# Patient Record
Sex: Female | Born: 1990 | Race: Black or African American | Hispanic: No | Marital: Single | Smoking: Never smoker
Health system: Southern US, Community
[De-identification: ages and names within clinical notes are randomized; demographics above are authoritative.]

## PROBLEM LIST (undated history)

## (undated) DIAGNOSIS — F250 Schizoaffective disorder, bipolar type: Secondary | ICD-10-CM

## (undated) DIAGNOSIS — R569 Unspecified convulsions: Secondary | ICD-10-CM

## (undated) DIAGNOSIS — F259 Schizoaffective disorder, unspecified: Secondary | ICD-10-CM

## (undated) DIAGNOSIS — F79 Unspecified intellectual disabilities: Secondary | ICD-10-CM

---
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
## 2011-01-15 DIAGNOSIS — F79 Unspecified intellectual disabilities: Secondary | ICD-10-CM

## 2012-08-20 DIAGNOSIS — F29 Unspecified psychosis not due to a substance or known physiological condition: Secondary | ICD-10-CM | POA: Insufficient documentation

## 2012-08-20 DIAGNOSIS — G40909 Epilepsy, unspecified, not intractable, without status epilepticus: Secondary | ICD-10-CM | POA: Insufficient documentation

## 2012-11-13 ENCOUNTER — Inpatient Hospital Stay: Payer: Self-pay | Admitting: Internal Medicine

## 2012-11-13 LAB — URINALYSIS, COMPLETE
Bilirubin,UR: NEGATIVE
Glucose,UR: NEGATIVE mg/dL (ref 0–75)
Nitrite: NEGATIVE
Nitrite: NEGATIVE
Ph: 5 (ref 4.5–8.0)
Ph: 6 (ref 4.5–8.0)
Protein: NEGATIVE
RBC,UR: 160 /HPF (ref 0–5)
RBC,UR: 39 /HPF (ref 0–5)
Specific Gravity: 1.013 (ref 1.003–1.030)
Specific Gravity: 1.006 (ref 1.003–1.030)
WBC UR: 5 /HPF (ref 0–5)

## 2012-11-13 LAB — COMPREHENSIVE METABOLIC PANEL
Albumin: 3.6 g/dL (ref 3.4–5.0)
Anion Gap: 6 — ABNORMAL LOW (ref 7–16)
Bilirubin,Total: 0.4 mg/dL (ref 0.2–1.0)
Calcium, Total: 9.3 mg/dL (ref 8.5–10.1)
Chloride: 102 mmol/L (ref 98–107)
EGFR (African American): >60
EGFR (Non-African Amer.): >60
Glucose: 124 mg/dL — ABNORMAL HIGH (ref 65–99)
SGOT(AST): 17 U/L (ref 15–37)
SGPT (ALT): 21 U/L (ref 12–78)
Sodium: 135 mmol/L — ABNORMAL LOW (ref 136–145)

## 2012-11-13 LAB — CBC
HCT: 32.6 % — ABNORMAL LOW (ref 35.0–47.0)
HGB: 11.8 g/dL — ABNORMAL LOW (ref 12.0–16.0)
MCH: 31.7 pg (ref 26.0–34.0)
MCV: 88 fL (ref 80–100)
Platelet: 195 10*3/uL (ref 150–440)
WBC: 7.2 10*3/uL (ref 3.6–11.0)

## 2012-11-15 LAB — URINE CULTURE

## 2012-11-18 LAB — CULTURE, BLOOD (SINGLE)

## 2012-12-27 DIAGNOSIS — N911 Secondary amenorrhea: Secondary | ICD-10-CM | POA: Insufficient documentation

## 2014-07-28 NOTE — H&P (Signed)
PATIENT NAME:  Monica [name], Monica [name] MR#:  [id number] DATE OF BIRTH:  10/31/1990  DATE OF ADMISSION:  11/13/2012  PRIMARY CARE PHYSICIAN:  [facility] [place]   CODE STATUS: FULL CODE.   CHIEF COMPLAINT: Fevers.   HISTORY OF PRESENT ILLNESS: This is a 24 year old female with history of mental retardation, who since yesterday has been having fevers persistent as high as 101. She has been having chills. Her appetite has been off. She has been somewhat confused. For this reason they brought her to the ER today. In the ER the patient was found to have temperatures up to 103. There is no report of any nausea and vomiting. The patient denies having any burning on urination but she does have some mental retardation. She denies any pain. There no reports of any lightheadedness or dizziness and no history of any recurrent UTIs. The hospitalist has been consulted to admit for urosepsis  REVIEW OF SYSTEMS: Difficult to obtain given patient's mental retardation.   PAST MEDICAL HISTORY: Includes MR and a history of seizure disorders.   PAST SURGICAL HISTORY: None.  MEDICATIONS: Seroquel 300 mg 2 tablets daily and 100 mg p.r.n., Senna 2 tablets p.r.n. constipation. Norco 5 p.r.n. pain, Neurontin 300 mg 2 capsules ? ,  ketoconazole cream for her skin, Nizoral topical shampoo, hydrocortisone topical cream p.r.n. Fanapt 6 mg tablets twice daily, Divalproex 500 mg 2 times daily, senna smooth scalp topical oil p.r.n. skin, Cleocin and amoxicillin p.r.n.   ALLERGIES: No known drug allergies.   SOCIAL HISTORY: Negative for tobacco, alcohol, illicit drugs. The patient resides at ? services.  She does not use a cane or walker. She is on home oxygen.   FAMILY HISTORY: Unknown and unable to obtain currently due to the patient's mentation.   PHYSICAL EXAMINATION:  VITAL SIGNS:  Blood pressure 76/50, pulse 153, respirations 20, temperature 103.2.  GENERAL: Awake, alert. The patient is asking to be fed.  EYES: Pink  conjunctivae. PERRLA.   ENT:  Moist oral mucosa. Trachea midline.  NECK: Supple.  LUNGS: Clear. No wheeze. No use of accessory muscles.  CARDIOVASCULAR: Regular rate and rhythm without murmurs, regurgitation or gallops. No JVD.  ABDOMEN: Soft, positive bowel sounds. Nontender, nondistended. No organomegaly.  NEUROLOGIC: Cranial nerves II through XII grossly intact. Sensation intact.  MUSCULOSKELETAL: Strength 5 out of 5 in all extremities. No clubbing, cyanosis tenderness. No CVA tenderness.  SKIN: The patient has diffuse rash appears to be fungal in the region.    LABORATORY DATA:  Fingerstick blood sugar 141. UA: The patient has white blood cells ? bacteria 1+, nitrates are negative, leukocyte esterase 3+ white blood cell clumps are present, (unclear if this is a cath specimen.) White blood count 7.2, hemoglobin 11.8, platelets 195, sodium 135, potassium 3.7, chloride 107, CO2 27, BUN 11, creatinine 1.16, glucose 124. LFTs are normal. Valproic acid level is 98.   ASSESSMENT AND PLAN: 1.  Urosepsis with hypotension. The patiuent looks better than her labs. I am going to repeat a BP and urinalysis and be sure that it is a cath specimen as the patient's urinalysis is somewhat odd given the large amount of leukocyte esterase and the few number of bacteria. Blood cultures will be collected. The patient will be started on empiric antibiotics. We will give the patient a bolus of 1 liter normal saline and have vital repeated. We will also add a chest x-ray to look for other etiology for the patient's fever. Tylenol with offered p.r.n. 2.  Mental  retardation, seizure disorder. Continue patient's home medications    ____________________________ Gery Prayebby Nakai Pollio, MD dc:dp D: 11/13/2012 16:59:25 ET T: 11/13/2012 17:25:26 ET JOB#: [id number]  cc: Gery Prayebby Juan Kissoon, MD, <Dictator> Gery PrayEBBY Skylen Spiering MD ELECTRONICALLY SIGNED 11/14/2012 17:53

## 2014-07-28 NOTE — Discharge Summary (Signed)
PATIENT NAME:  Monica [name], Monica [name] MR#:  [id number] DATE OF BIRTH:  Oct 07, 1990  DATE OF ADMISSION:  11/13/2012 DATE OF DISCHARGE:  11/14/2012  ADMITTING DIAGNOSIS: Urosepsis.   DISCHARGE DIAGNOSES:  1.  Systemic inflammatory response reaction.  2.  Questionable urinary tract infection.  3.  Questionable pneumonia.  4.  Hypotension that resolved on IV fluids.  5.  History of mental retardation as well as seizure disorder.   DISCHARGE CONDITION: Stable.   DISCHARGE MEDICATIONS: The patient is to continue ketoconazole topical cream once a day, divalproex sodium 500 mg p.o. twice daily, Fanapt 6 mg twice daily, Derma-Smoothe topical oil once a week, Nizoral topical 2% topical shampoo once a week, amoxicillin 500 mg by mouth 4 capsules once as needed before dental procedures, Seroquel 300 mg 2 tablets once daily at bedtime, Neurontin 300 mg  2 capsules at bedtime, hydrocortisone topical cream 1-2 times a week as needed, Seroquel 100 mg p.o. once daily as needed, Senexon 8.6 mg 3 tablets once at bedtime as needed, Norco 375/5 mg 1 tablet every six hours as needed, Levaquin 750 mg p.o. once daily for five days.   HOME OXYGEN: None.   DIET: Regular with regular consistency.    ACTIVITY: As tolerated.    FOLLOW-UP:  [facility] [place] primary care physician in 1 to 2 days after discharge.   CONSULTANTS: Care management.   RADIOLOGIC STUDIES: Chest x-ray, portable single view, November 13, 2012 revealed limited study due to hyperinflation, atelectasis or pneumonia in left lower lobe was suspected. PA and lateral chest x-rays were recommended for evaluation.   HOSPITAL COURSE:  The patient is a 24 year old African American female with a history of mental retardation who presents to the hospital with high fevers as well as chills. Please refer to Dr. Clovis Fredrickson admission note on November 13, 2012. The patient was somewhat confused and she was noted to have temperatures as high as 103. Otherwise, she had no  significant abnormalities. The physical exam was done unremarkable. However, the patient's blood pressure was low at 76/50 in the Emergency Room and she was tachycardic with pulse of 153. The patient's lab data done in the Emergency Room showed a normal BMP except for sodium 135, glucose 124, otherwise BMP was unremarkable. The patient's liver enzymes were unremarkable; however, the patient's total protein level was slightly elevated at 8.6. The patient's valproic acid level was 98, which was therapeutic. White blood cell count was normal at 10.2, hemoglobin was 11.8, and platelet count was 195. The patient's urinalysis revealed cloudy urine, negative for glucose, bilirubin or ketones. Specific gravity was 1.013, pH was 5, 3+ blood, negative for protein, nitrites, 3+ leukocytes esterase were noted, 160 red blood cells, 178 white blood cells were noted as well as 1+ bacteria and 1 epithelial cell and white blood cell clumps. The patient was admitted to the hospital for further evaluation. She was started on antibiotic therapy with Rocephin; with this the patient's fevers subsided. On 11/14/2012, the patient was afebrile feeling well. She remained, however, tachycardic, but refused any IV fluid administration. We felt that behavioral problems precluded her with  treatment here. However, since her blood pressure was within normal limits, we felt that the patient can be treated as outpatient with oral antibiotics. Because her chest x-ray was somewhat abnormal, concerned about possible pneumonia, we made a decision to initiate her on Levaquin orally. The patient will use Levaquin for five days and follow up with her primary care physician for further recommendations. On  the day of discharge the patient's temperature was 99.4, pulse ranging from 105 to 130, respiration rate was 17-20, blood pressure ranging from 124-117 systolic and oxygen saturation were 98% to 99% on room air at rest. Of note, the patient's blood  cultures x 2 taken on 11/13/2012, showed no growth in 8-12 hours. Urine culture revealed a 60 (Dictation Anomaly)thousand cfu  gram-negative rod; ID to follow. Repeated urinalysis done on the same day, 11/13/2012, was also found to be abnormal; however, there were more red cells than white blood cells, but white cell clumps were also noted concerning for urinary tract infection.   TIME SPENT: 40 minutes   ____________________________ Katharina Caperima Diesel Lina, MD rv:cc D: 11/14/2012 14:48:46 ET T: 11/14/2012 22:14:05 ET JOB#: [id number]  cc: Katharina Caperima Demont Linford, MD, <Dictator> [facility] [place] Edita Weyenberg MD ELECTRONICALLY SIGNED 11/24/2012 12:08

## 2014-12-16 ENCOUNTER — Encounter: Payer: Self-pay | Admitting: Emergency Medicine

## 2014-12-16 ENCOUNTER — Emergency Department
Admission: EM | Admit: 2014-12-16 | Discharge: 2014-12-16 | Disposition: A | Discharge status: Home / Self Care | Payer: Medicaid Other | Attending: Emergency Medicine | Admitting: Emergency Medicine

## 2014-12-16 DIAGNOSIS — Y9389 Activity, other specified: Secondary | ICD-10-CM | POA: Diagnosis not present

## 2014-12-16 DIAGNOSIS — Y9289 Other specified places as the place of occurrence of the external cause: Secondary | ICD-10-CM | POA: Diagnosis not present

## 2014-12-16 DIAGNOSIS — S0990XA Unspecified injury of head, initial encounter: Secondary | ICD-10-CM | POA: Diagnosis present

## 2014-12-16 DIAGNOSIS — W2209XA Striking against other stationary object, initial encounter: Secondary | ICD-10-CM | POA: Insufficient documentation

## 2014-12-16 DIAGNOSIS — Y998 Other external cause status: Secondary | ICD-10-CM | POA: Diagnosis not present

## 2014-12-16 DIAGNOSIS — S0093XA Contusion of unspecified part of head, initial encounter: Secondary | ICD-10-CM | POA: Insufficient documentation

## 2014-12-16 DIAGNOSIS — F259 Schizoaffective disorder, unspecified: Secondary | ICD-10-CM | POA: Insufficient documentation

## 2014-12-16 HISTORY — DX: Unspecified convulsions: R56.9

## 2014-12-16 HISTORY — DX: Schizoaffective disorder, bipolar type: F25.0

## 2014-12-16 HISTORY — DX: Unspecified intellectual disabilities: F79

## 2014-12-16 HISTORY — DX: Schizoaffective disorder, unspecified: F25.9

## 2014-12-16 NOTE — ED Provider Notes (Signed)
[facility] Emergency Department Provider Note  ____________________________________________  Time seen: Approximately 6:56 PM  I have reviewed the triage vital signs and the nursing notes.   HISTORY  Chief Complaint Head Injury    HPI Monica [name] is a 24 y.o. female born in by care giver. Patient states patient is taking a shower bent over and hit her head on the corner of the wall. There was no loss of consciousness caregiver's nose no abrasions or lacerations. Patient brought back here for medical clearance for returning back home. Patient is mentally handicapped.   Past Medical History  Diagnosis Date  . Mentally disabled   . Seizures   . Schizo affective schizophrenia     There are no active problems to display for this patient.   History reviewed. No pertinent past surgical history.  No current outpatient prescriptions on file.  Allergies Review of patient's allergies indicates no known allergies.  No family history on file.  Social History Social History  Substance Use Topics  . Smoking status: Never Smoker   . Smokeless tobacco: None  . Alcohol Use: No    Review of Systems Constitutional: No fever/chills Eyes: No visual changes. ENT: No sore throat. Cardiovascular: Denies chest pain. Respiratory: Denies shortness of breath. Gastrointestinal: No abdominal pain.  No nausea, no vomiting.  No diarrhea.  No constipation. Genitourinary: Negative for dysuria. Musculoskeletal: Negative for back pain. Skin: Negative for rash. Neurological: Negative for headaches, focal weakness or numbness. Psychiatric:Schizo affective disorder 10-point ROS otherwise negative.  ____________________________________________   PHYSICAL EXAM:  VITAL SIGNS: ED Triage Vitals  Enc Vitals Group     BP 12/16/14 1815 124/84 mmHg     Pulse Rate 12/16/14 1815 109     Resp 12/16/14 1815 18     Temp 12/16/14 1815 98.4 F (36.9 C)     Temp Source  12/16/14 1815 Oral     SpO2 12/16/14 1815 98 %     Weight 12/16/14 1841 153 lb (69.4 kg)     Height --      Head Cir --      Peak Flow --      Pain Score --      Pain Loc --      Pain Edu? --      Excl. in GC? --     Constitutional: Alert and oriented. Well appearing and in no acute distress. Eyes: Conjunctivae are normal. PERRL. EOMI. Head: Atraumatic. No abrasions or ecchymosis or laceration. Nose: No congestion/rhinnorhea. Mouth/Throat: Mucous membranes are moist.  Oropharynx non-erythematous. Neck: No stridor.   Hematological/Lymphatic/Immunilogical: No cervical lymphadenopathy. Cardiovascular: Normal rate, regular rhythm. Grossly normal heart sounds.  Good peripheral circulation. Respiratory: Normal respiratory effort.  No retractions. Lungs CTAB. Gastrointestinal: Soft and nontender. No distention. No abdominal bruits. No CVA tenderness. Musculoskeletal: No lower extremity tenderness nor edema.  No joint effusions. Neurologic:  Normal speech and language. No gross focal neurologic deficits are appreciated. No gait instability. Skin:  Skin is warm, dry and intact. No rash noted. Psychiatric: Mood and affect are normal. Speech and behavior are normal.  ____________________________________________   LABS (all labs ordered are listed, but only abnormal results are displayed)  Labs Reviewed - No data to display ____________________________________________  EKG   ____________________________________________  RADIOLOGY   ____________________________________________   PROCEDURES  Procedure(s) performed: None  Critical Care performed: No  ____________________________________________   INITIAL IMPRESSION / ASSESSMENT AND PLAN / ED COURSE  Pertinent labs & imaging results that were  available during my care of the patient were reviewed by me and considered in my medical decision making (see chart for details).  Facial contusion. Advised on supportive care at this  time. Patient follow up PCP as needed. ____________________________________________   FINAL CLINICAL IMPRESSION(S) / ED DIAGNOSES  Final diagnoses:  Head contusion, initial encounter      Joni Reining, PA-C 12/16/14 1905  Myrna Blazer, MD 12/17/14 [phone]

## 2014-12-16 NOTE — ED Notes (Addendum)
Pt brought in by caregiver. Caregiver reports that the pt was in the shower and hit her while bending down to wash her legs. Denies LOC or abrasions. Pt denies pain. Pt is mentally handicapped.

## 2015-01-17 ENCOUNTER — Emergency Department
Admission: EM | Admit: 2015-01-17 | Discharge: 2015-01-17 | Disposition: A | Discharge status: Home / Self Care | Payer: Medicaid Other | Attending: Emergency Medicine | Admitting: Emergency Medicine

## 2015-01-17 ENCOUNTER — Emergency Department: Payer: Medicaid Other

## 2015-01-17 DIAGNOSIS — S0083XA Contusion of other part of head, initial encounter: Secondary | ICD-10-CM | POA: Insufficient documentation

## 2015-01-17 DIAGNOSIS — Y998 Other external cause status: Secondary | ICD-10-CM | POA: Insufficient documentation

## 2015-01-17 DIAGNOSIS — W2209XA Striking against other stationary object, initial encounter: Secondary | ICD-10-CM | POA: Diagnosis not present

## 2015-01-17 DIAGNOSIS — S0990XA Unspecified injury of head, initial encounter: Secondary | ICD-10-CM | POA: Diagnosis present

## 2015-01-17 DIAGNOSIS — Y9389 Activity, other specified: Secondary | ICD-10-CM | POA: Diagnosis not present

## 2015-01-17 DIAGNOSIS — Y9289 Other specified places as the place of occurrence of the external cause: Secondary | ICD-10-CM | POA: Diagnosis not present

## 2015-01-17 DIAGNOSIS — S0093XA Contusion of unspecified part of head, initial encounter: Secondary | ICD-10-CM

## 2015-01-17 MED ORDER — IBUPROFEN 600 MG PO TABS: 600.0000 mg | ORAL_TABLET | Freq: Four times a day (QID) | ORAL | Status: AC | PRN

## 2015-01-17 NOTE — Discharge Instructions (Signed)
Head Injury, Adult °You have a head injury. Headaches and throwing up (vomiting) are common after a head injury. It should be easy to wake up from sleeping. Sometimes you must stay in the hospital. Most problems happen within the first 24 hours. Side effects may occur up to 7-10 days after the injury.  °WHAT ARE THE TYPES OF HEAD INJURIES? °Head injuries can be as minor as a bump. Some head injuries can be more severe. More severe head injuries include: °· A jarring injury to the brain (concussion). °· A bruise of the brain (contusion). This mean there is bleeding in the brain that can cause swelling. °· A cracked skull (skull fracture). °· Bleeding in the brain that collects, clots, and forms a bump (hematoma). °WHEN SHOULD I GET HELP RIGHT AWAY?  °· You are confused or sleepy. °· You cannot be woken up. °· You feel sick to your stomach (nauseous) or keep throwing up (vomiting). °· Your dizziness or unsteadiness is getting worse. °· You have very bad, lasting headaches that are not helped by medicine. Take medicines only as told by your doctor. °· You cannot use your arms or legs like normal. °· You cannot walk. °· You notice changes in the black spots in the center of the colored part of your eye (pupil). °· You have clear or bloody fluid coming from your nose or ears. °· You have trouble seeing. °During the next 24 hours after the injury, you must stay with someone who can watch you. This person should get help right away (call 911 in the [place] if you start to shake and are not able to control it (have seizures), you pass out, or you are unable to wake up. °HOW CAN I PREVENT A HEAD INJURY IN THE FUTURE? °· Wear seat belts. °· Wear a helmet while bike riding and playing sports like football. °· Stay away from dangerous activities around the house. °WHEN CAN I RETURN TO NORMAL ACTIVITIES AND ATHLETICS? °See your doctor before doing these activities. You should not do normal activities or play contact sports until 1  week after the following symptoms have stopped: °· Headache that does not go away. °· Dizziness. °· Poor attention. °· Confusion. °· Memory problems. °· Sickness to your stomach or throwing up. °· Tiredness. °· Fussiness. °· Bothered by bright lights or loud noises. °· Anxiousness or depression. °· Restless sleep. °MAKE SURE YOU:  °· Understand these instructions. °· Will watch your condition. °· Will get help right away if you are not doing well or get worse. °  °This information is not intended to replace advice given to you by your health care provider. Make sure you discuss any questions you have with your health care provider. °  °Document Released: 03/06/2008 Document Revised: 04/14/2014 Document Reviewed: 11/29/2012 °Elsevier Interactive Patient Education ©2016 [employer] ° °

## 2015-01-17 NOTE — ED Notes (Signed)
Patient had fall this morning at 7 am.  Per caregiver patient was giving self bath and reached down to pick up clothes and hit forehead on the wall.  Per caregiver she is at her baseline and per group home protocol they have to bring patient in to be assessed. Caregiver reports that patient has complained of headache intermittently since hitting head.

## 2015-01-17 NOTE — ED Provider Notes (Signed)
[facility] Emergency Department Provider Note  ____________________________________________  Time seen: Approximately 3:25 PM  I have reviewed the triage vital signs and the nursing notes.   HISTORY  Chief Complaint Headache    HPI Monica [name] is a 24 y.o. female who is mentally disabled with a secondary schizoaffective disorder who presents with complaints of a headache since this morning. Patient states that she hit her head on the wall when bending over the past her suitcase. Patient presents with caregiver who essentially relates the story.   Past Medical History  Diagnosis Date  . Mentally disabled   . Seizures (HCC)   . Schizo affective schizophrenia (HCC)     There are no active problems to display for this patient.   History reviewed. No pertinent past surgical history.  Current Outpatient Rx  Name  Route  Sig  Dispense  Refill  . ibuprofen (ADVIL,MOTRIN) 600 MG tablet   Oral   Take 1 tablet (600 mg total) by mouth every 6 (six) hours as needed.   30 tablet   0     Allergies Review of patient's allergies indicates no known allergies.  History reviewed. No pertinent family history.  Social History Social History  Substance Use Topics  . Smoking status: Never Smoker   . Smokeless tobacco: None  . Alcohol Use: No    Review of Systems Constitutional: No fever/chills Eyes: No visual changes. ENT: No sore throat. Cardiovascular: Denies chest pain. Respiratory: Denies shortness of breath. Gastrointestinal: No abdominal pain.  No nausea, no vomiting.  No diarrhea.  No constipation. Genitourinary: Negative for dysuria. Musculoskeletal: Negative for back pain. Skin: Negative for rash. Neurological: Positive for headaches negative for focal weakness or numbness.  10-point ROS otherwise negative.  ____________________________________________   PHYSICAL EXAM:  VITAL SIGNS: ED Triage Vitals  Enc Vitals Group     BP  01/17/15 1414 126/83 mmHg     Pulse Rate 01/17/15 1414 118     Resp --      Temp 01/17/15 1414 98.5 F (36.9 C)     Temp Source 01/17/15 1414 Oral     SpO2 01/17/15 1414 99 %     Weight 01/17/15 1414 170 lb (77.111 kg)     Height 01/17/15 1414 5' (1.524 m)     Head Cir --      Peak Flow --      Pain Score --      Pain Loc --      Pain Edu? --      Excl. in GC? --    Constitutional: Alert and oriented. Well appearing and in no acute distress. Eyes: Conjunctivae are normal. PERRL. EOMI. Head: Atraumatic. Nose: No congestion/rhinnorhea. Mouth/Throat: Mucous membranes are moist.  Oropharynx non-erythematous. Neck: No stridor.  No cervical spinal tenderness. Cardiovascular: Normal rate, regular rhythm. Grossly normal heart sounds.  Good peripheral circulation. Respiratory: Normal respiratory effort.  No retractions. Lungs CTAB. Musculoskeletal: No lower extremity tenderness nor edema.  No joint effusions. Neurologic:  Normal speech and language. No gross focal neurologic deficits are appreciated. No gait instability. Skin:  Skin is warm, dry and intact. No rash noted. Psychiatric: Mood and affect are normal. Speech and behavior are normal.  ____________________________________________   LABS (all labs ordered are listed, but only abnormal results are displayed)  Labs Reviewed - No data to display ____________________________________________  RADIOLOGY  No intracranial hemorrhage, mass lesion, or acute CVA. ____________________________________________   PROCEDURES  Procedure(s) performed: None  Critical Care  performed: No  ____________________________________________   INITIAL IMPRESSION / ASSESSMENT AND PLAN / ED COURSE  Pertinent labs & imaging results that were available during my care of the patient were reviewed by me and considered in my medical decision making (see chart for details).  Acute head contusion. Rx given for ibuprofen 600 mg as needed for  headache. Patient follow-up with PCP or return to the ER with any new development or worsening symptomology. ____________________________________________   FINAL CLINICAL IMPRESSION(S) / ED DIAGNOSES  Final diagnoses:  Head contusion, initial encounter      Evangeline Dakinharles M Beers, PA-C 01/17/15 1626  Phineas SemenGraydon Goodman, MD 01/17/15 [phone]

## 2015-05-03 ENCOUNTER — Emergency Department
Admission: EM | Admit: 2015-05-03 | Discharge: 2015-05-03 | Disposition: A | Discharge status: Home / Self Care | Payer: Medicaid Other | Attending: Emergency Medicine | Admitting: Emergency Medicine

## 2015-05-03 DIAGNOSIS — F99 Mental disorder, not otherwise specified: Secondary | ICD-10-CM | POA: Insufficient documentation

## 2015-05-03 DIAGNOSIS — F79 Unspecified intellectual disabilities: Secondary | ICD-10-CM

## 2015-05-03 DIAGNOSIS — Z008 Encounter for other general examination: Secondary | ICD-10-CM | POA: Diagnosis present

## 2015-05-03 DIAGNOSIS — Z3202 Encounter for pregnancy test, result negative: Secondary | ICD-10-CM | POA: Insufficient documentation

## 2015-05-03 DIAGNOSIS — F911 Conduct disorder, childhood-onset type: Secondary | ICD-10-CM | POA: Insufficient documentation

## 2015-05-03 DIAGNOSIS — Z79899 Other long term (current) drug therapy: Secondary | ICD-10-CM | POA: Diagnosis not present

## 2015-05-03 DIAGNOSIS — R4689 Other symptoms and signs involving appearance and behavior: Secondary | ICD-10-CM

## 2015-05-03 LAB — CBC WITH DIFFERENTIAL/PLATELET
BASOS ABS: 0 10*3/uL (ref 0–0.1)
BASOS PCT: 0 %
EOS PCT: 1 %
Eosinophils Absolute: 0 10*3/uL (ref 0–0.7)
HCT: 35.5 % (ref 35.0–47.0)
Hemoglobin: 12 g/dL (ref 12.0–16.0)
Lymphocytes Relative: 46 %
Lymphs Abs: 1.9 10*3/uL (ref 1.0–3.6)
MCH: 30.7 pg (ref 26.0–34.0)
MCHC: 33.7 g/dL (ref 32.0–36.0)
MCV: 90.9 fL (ref 80.0–100.0)
MONO ABS: 0.5 10*3/uL (ref 0.2–0.9)
Monocytes Relative: 13 %
Neutro Abs: 1.6 10*3/uL (ref 1.4–6.5)
Neutrophils Relative %: 40 %
PLATELETS: 176 10*3/uL (ref 150–440)
RBC: 3.91 MIL/uL (ref 3.80–5.20)
RDW: 14.1 % (ref 11.5–14.5)
WBC: 4 10*3/uL (ref 3.6–11.0)

## 2015-05-03 LAB — COMPREHENSIVE METABOLIC PANEL
ALBUMIN: 4.4 g/dL (ref 3.5–5.0)
ALT: 21 U/L (ref 14–54)
ANION GAP: 11 (ref 5–15)
AST: 20 U/L (ref 15–41)
Alkaline Phosphatase: 66 U/L (ref 38–126)
BUN: 9 mg/dL (ref 6–20)
CHLORIDE: 105 mmol/L (ref 101–111)
CO2: 24 mmol/L (ref 22–32)
Calcium: 10 mg/dL (ref 8.9–10.3)
Creatinine, Ser: 0.88 mg/dL (ref 0.44–1.00)
GFR calc Af Amer: >60 mL/min (ref >60)
GLUCOSE: 133 mg/dL — AB (ref 65–99)
POTASSIUM: 3.6 mmol/L (ref 3.5–5.1)
Sodium: 140 mmol/L (ref 135–145)
Total Bilirubin: 0.8 mg/dL (ref 0.3–1.2)
Total Protein: 7.9 g/dL (ref 6.5–8.1)

## 2015-05-03 LAB — URINALYSIS COMPLETE WITH MICROSCOPIC (ARMC ONLY)
Bacteria, UA: NONE SEEN
Bilirubin Urine: NEGATIVE
Glucose, UA: NEGATIVE mg/dL
Hgb urine dipstick: NEGATIVE
KETONES UR: NEGATIVE mg/dL
LEUKOCYTES UA: NEGATIVE
Nitrite: NEGATIVE
PH: 6 (ref 5.0–8.0)
PROTEIN: NEGATIVE mg/dL
SPECIFIC GRAVITY, URINE: 1.009 (ref 1.005–1.030)

## 2015-05-03 LAB — VALPROIC ACID LEVEL: VALPROIC ACID LVL: 91 ug/mL (ref 50.0–100.0)

## 2015-05-03 LAB — PREGNANCY, URINE: PREG TEST UR: NEGATIVE

## 2015-05-03 MED ORDER — SENNA 8.6 MG PO TABS
2.0000 | ORAL_TABLET | Freq: Every day | ORAL | Status: DC | PRN
  Filled 2015-05-03: qty 2

## 2015-05-03 MED ORDER — ILOPERIDONE 4 MG PO TABS
8.0000 mg | ORAL_TABLET | Freq: Two times a day (BID) | ORAL | Status: DC
  Administered 2015-05-03: 8 mg via ORAL
  Filled 2015-05-03 (×2): qty 2

## 2015-05-03 MED ORDER — TRAZODONE HCL 50 MG PO TABS
25.0000 mg | ORAL_TABLET | Freq: Every day | ORAL | Status: DC
  Filled 2015-05-03: qty 1

## 2015-05-03 MED ORDER — DIVALPROEX SODIUM 500 MG PO DR TAB
500.0000 mg | DELAYED_RELEASE_TABLET | Freq: Two times a day (BID) | ORAL | Status: DC
  Administered 2015-05-03: 500 mg via ORAL
  Filled 2015-05-03 (×2): qty 1

## 2015-05-03 MED ORDER — POLYETHYLENE GLYCOL 3350 17 G PO PACK
17.0000 g | PACK | Freq: Every day | ORAL | Status: DC
  Administered 2015-05-03: 17 g via ORAL
  Filled 2015-05-03: qty 1

## 2015-05-03 MED ORDER — GABAPENTIN 400 MG PO CAPS
1200.0000 mg | ORAL_CAPSULE | Freq: Every day | ORAL | Status: DC
  Filled 2015-05-03: qty 3

## 2015-05-03 MED ORDER — QUETIAPINE FUMARATE 200 MG PO TABS
800.0000 mg | ORAL_TABLET | Freq: Every day | ORAL | Status: DC
  Filled 2015-05-03: qty 4

## 2015-05-03 NOTE — ED Notes (Signed)
Pt. In room with care provider.  Pt. In agitated stated.  Pt. Wants paper to write on.  Pt. Given some sheets of paper to right on.

## 2015-05-03 NOTE — Progress Notes (Signed)
LCSW called Group home [phone] and was directed to call the coordinator Lamar Blinks [phone] to see if patient can return. Left a detailed message awaiting call.  Monica [name] ; [phone]  ( guardian) Reports patient was doing well but due to recent staff changes patient has acted out. ( Not handling the new staff changes well and acts out) Grandma feels this must be it  when she goes out with family she is fine no acting out at all. She was inquiring if pt to could have a second opinion.  Awaiting call back from director, according to her guardian patient is able to return to her group home

## 2015-05-03 NOTE — Progress Notes (Signed)
Recalled director and left another message awaiting call back. ( this is to confirm patient is able to return to her group home) Guardian believes she can, but we are still awaiting call.  [employer] LCSW [phone]

## 2015-05-03 NOTE — ED Provider Notes (Addendum)
-----------------------------------------   1:17 PM on 05/03/2015 -----------------------------------------  Pt seen and evaluated by psych. At her baseline with no indication for hospitalization. Will d/c per their recommendations. Patient does have mild tachycardia, she is somewhat anxious at being in a new place. There is no evidence of PE or infection she is no evidence of withdrawal from benzos or other medications. We will discharge her home patient is eager to leave  Jeanmarie Plant, MD 05/03/15 1318  Jeanmarie Plant, MD 05/03/15 1406

## 2015-05-03 NOTE — Discharge Instructions (Signed)
Return to the emergency room for any new or concerning symptoms follow closely with her doctor
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
## 2015-05-03 NOTE — ED Notes (Signed)
Caretaker in room with patient.
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
## 2015-05-03 NOTE — ED Notes (Signed)
Psych MD to bedside.

## 2015-05-03 NOTE — ED Notes (Signed)
Pt. Has paper and crayons, pt. encouraged to draw pictures.  Pt. Television turned on and channel changed to cartoon station.

## 2015-05-03 NOTE — Progress Notes (Signed)
LCSW received a call [facility] Director Thelma Barge [phone],explained that I have verbal consent from guardian to speak with Rada Hay. she has requested Dr Toni Amend call the nurse at their facility [phone], it was explained that patient is ready for discharge and to be picked up.  LCSW read a few summations from patients chart and expressed that patient has been discharged. It was also expressed that a discharge document will be sent back with patient. They will call us back with a pick up time LCSW provided them with Nursing Desk #.

## 2015-05-03 NOTE — Progress Notes (Signed)
LCSW called [facility] and spoke to Ms Jillyn Hidden, she reported that a staff will be on their way shortly to pick up patient, informed ED nurse and psychiatrist.  Arrie Senate LCSW

## 2015-05-03 NOTE — ED Provider Notes (Signed)
[facility] Emergency Department Provider Note  ____________________________________________  Time seen: Approximately 1:02 AM  I have reviewed the triage vital signs and the nursing notes.   HISTORY  Chief Complaint Medical Clearance  History limited by chronic mental disabilities and inability to provide her own history  HPI RONEE RANGANATHAN is a 25 y.o. female with unspecified mental disability and schizoaffective disorder who presents for evaluation of "rage" that she exhibited at her group home earlier today.  She reportedly had multiple emotional and violent outbursts, but no other information is available at this time.  She is soothed by tearing up white sheets of paper which she is doing now and she is calm and cooperative.  She is not able to provide any history herself.  She denies any pain or discomfort.  She is hungry and asking for food.  Reportedly her symptoms were severe but they have resolved at this time.     Past Medical History  Diagnosis Date  . Mentally disabled   . Seizures (HCC)   . Schizo affective schizophrenia (HCC)     There are no active problems to display for this patient.   History reviewed. No pertinent past surgical history.  Current Outpatient Rx  Name  Route  Sig  Dispense  Refill  . desonide (DESOWEN) 0.05 % cream   Topical   Apply 1 application topically 2 (two) times daily.         . divalproex (DEPAKOTE) 500 MG DR tablet   Oral   Take 500 mg by mouth 2 (two) times daily.         Marland Kitchen gabapentin (NEURONTIN) 300 MG capsule   Oral   Take 1,200 mg by mouth at bedtime.         Marland Kitchen ibuprofen (ADVIL,MOTRIN) 600 MG tablet   Oral   Take 1 tablet (600 mg total) by mouth every 6 (six) hours as needed.   30 tablet   0   . iloperidone (FANAPT) 4 MG TABS tablet   Oral   Take 8 mg by mouth 2 (two) times daily.         Marland Kitchen ketoconazole (NIZORAL) 2 % cream   Topical   Apply 1 application topically daily.         Marland Kitchen ketoconazole (NIZORAL) 2 % shampoo   Topical   Apply 1 application topically 2 (two) times a week.         . Melatonin 5 MG TABS   Oral   Take 1 tablet by mouth at bedtime as needed.         . polyethylene glycol (MIRALAX / GLYCOLAX) packet   Oral   Take 17 g by mouth daily.         . QUEtiapine (SEROQUEL) 100 MG tablet   Oral   Take 100 mg by mouth daily as needed.         Marland Kitchen QUEtiapine (SEROQUEL) 400 MG tablet   Oral   Take 800 mg by mouth at bedtime.         . senna (SENOKOT) 8.6 MG tablet   Oral   Take 2 tablets by mouth daily as needed for constipation.         . traZODone (DESYREL) 50 MG tablet   Oral   Take 25 mg by mouth at bedtime.           Allergies Review of patient's allergies indicates no known allergies.  No family history on file.  Social History Social History  Substance Use Topics  . Smoking status: Never Smoker   . Smokeless tobacco: None  . Alcohol Use: No    Review of Systems The patient is unable to provide a reliable review of systems given her chronic mental disability.  However she has no acute complaints at this time  ____________________________________________   PHYSICAL EXAM:  VITAL SIGNS: ED Triage Vitals  Enc Vitals Group     BP 05/03/15 0006 138/88 mmHg     Pulse Rate 05/03/15 0006 123     Resp 05/03/15 0006 18     Temp 05/03/15 0006 98.2 F (36.8 C)     Temp Source 05/03/15 0006 Oral     SpO2 05/03/15 0006 97 %     Weight 05/03/15 0006 159 lb 4 oz (72.235 kg)     Height 05/03/15 0006 5' (1.524 m)     Head Cir --      Peak Flow --      Pain Score 05/03/15 0009 0     Pain Loc --      Pain Edu? --      Excl. in GC? --     Constitutional: Awake and alert.  Appears to be at her baseline.  No acute distress. Head: Atraumatic. Nose: No congestion/rhinnorhea. Neck: No stridor.   Cardiovascular: Initially tachycardiac when she arrived anxious, but settled down into regular rate in exam room,  regular rhythm. Grossly normal heart sounds.  Good peripheral circulation. Respiratory: Normal respiratory effort.  No retractions. Lungs CTAB. Gastrointestinal: Soft and nontender. No distention. No abdominal bruits. No CVA tenderness. Musculoskeletal: No lower extremity tenderness nor edema.  No joint effusions. Neurologic:  Normal speech and language. No gross focal neurologic deficits are appreciated.  Skin:  Skin is warm, dry and intact. No rash noted. Psychiatric: Chronic disability, but calm and cooperative  ____________________________________________   LABS (all labs ordered are listed, but only abnormal results are displayed)  Labs Reviewed  COMPREHENSIVE METABOLIC PANEL - Abnormal; Notable for the following:    Glucose, Bld 133 (*)    All other components within normal limits  URINALYSIS COMPLETEWITH MICROSCOPIC ([facility] ONLY) - Abnormal; Notable for the following:    Color, Urine STRAW (*)    APPearance CLEAR (*)    Squamous Epithelial / LPF 0-5 (*)    All other components within normal limits  VALPROIC ACID LEVEL  CBC WITH DIFFERENTIAL/PLATELET  PREGNANCY, URINE   ____________________________________________  EKG  None ____________________________________________  RADIOLOGY   No results found.  ____________________________________________   PROCEDURES  Procedure(s) performed: None  Critical Care performed: No ____________________________________________   INITIAL IMPRESSION / ASSESSMENT AND PLAN / ED COURSE  Pertinent labs & imaging results that were available during my care of the patient were reviewed by me and considered in my medical decision making (see chart for details).  I suspect the patient was having an outburst similar to her priors due to her chronic issues, but the group home sent her for evaluation.  I will keep her for psychiatric and TTS evaluation tomorrow, and if necessary, they can consult social work if this becomes a disposition  issue with the group home.  No acute complaints at this time, labs WNL.  Ordered home meds which were verified by pharmacy tech.  ____________________________________________  FINAL CLINICAL IMPRESSION(S) / ED DIAGNOSES  Final diagnoses:  Aggressive behavior      NEW MEDICATIONS STARTED DURING THIS VISIT:  New Prescriptions   No medications on file  Loleta Rose, MD 05/03/15 [id number]
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
## 2015-05-15 ENCOUNTER — Encounter: Payer: Self-pay | Admitting: Medical Oncology

## 2015-05-15 ENCOUNTER — Emergency Department
Admission: EM | Admit: 2015-05-15 | Discharge: 2015-05-15 | Disposition: A | Discharge status: Home / Self Care | Payer: Medicaid Other | Attending: Emergency Medicine | Admitting: Emergency Medicine

## 2015-05-15 DIAGNOSIS — F911 Conduct disorder, childhood-onset type: Secondary | ICD-10-CM | POA: Diagnosis not present

## 2015-05-15 LAB — CBC
HEMATOCRIT: 36.7 % (ref 35.0–47.0)
Hemoglobin: 12.5 g/dL (ref 12.0–16.0)
MCH: 30.9 pg (ref 26.0–34.0)
MCHC: 34.2 g/dL (ref 32.0–36.0)
MCV: 90.2 fL (ref 80.0–100.0)
Platelets: 169 10*3/uL (ref 150–440)
RBC: 4.06 MIL/uL (ref 3.80–5.20)
RDW: 14.3 % (ref 11.5–14.5)
WBC: 3.7 10*3/uL (ref 3.6–11.0)
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
## 2015-05-15 NOTE — ED Notes (Signed)
Pts group home manager reports that his proper protocols were not followed and police shouldve never been called and pt should have not been seen in ED. Pt was taken back with group home staff and will follow up with PCP today at a scheduled appointment. Labs were canceled and pt was placed back into own clothing and belongings sent with staff.

## 2015-05-20 ENCOUNTER — Emergency Department
Admission: EM | Admit: 2015-05-20 | Discharge: 2015-05-21 | Disposition: A | Discharge status: Other Acute Inpatient Hospital | Payer: Medicaid Other | Attending: Emergency Medicine | Admitting: Emergency Medicine

## 2015-05-20 ENCOUNTER — Encounter: Payer: Self-pay | Admitting: Intensive Care

## 2015-05-20 DIAGNOSIS — F919 Conduct disorder, unspecified: Secondary | ICD-10-CM | POA: Insufficient documentation

## 2015-05-20 DIAGNOSIS — F6089 Other specific personality disorders: Secondary | ICD-10-CM | POA: Diagnosis not present

## 2015-05-20 DIAGNOSIS — Z3202 Encounter for pregnancy test, result negative: Secondary | ICD-10-CM | POA: Insufficient documentation

## 2015-05-20 DIAGNOSIS — F1310 Sedative, hypnotic or anxiolytic abuse, uncomplicated: Secondary | ICD-10-CM | POA: Diagnosis not present

## 2015-05-20 DIAGNOSIS — Z79899 Other long term (current) drug therapy: Secondary | ICD-10-CM | POA: Insufficient documentation

## 2015-05-20 DIAGNOSIS — R4689 Other symptoms and signs involving appearance and behavior: Secondary | ICD-10-CM | POA: Diagnosis present

## 2015-05-20 DIAGNOSIS — F309 Manic episode, unspecified: Secondary | ICD-10-CM | POA: Diagnosis present

## 2015-05-20 DIAGNOSIS — F79 Unspecified intellectual disabilities: Secondary | ICD-10-CM

## 2015-05-20 LAB — POCT PREGNANCY, URINE: Preg Test, Ur: NEGATIVE

## 2015-05-20 LAB — URINALYSIS COMPLETE WITH MICROSCOPIC (ARMC ONLY)
BILIRUBIN URINE: NEGATIVE
Bacteria, UA: NONE SEEN
GLUCOSE, UA: NEGATIVE mg/dL
HGB URINE DIPSTICK: NEGATIVE
KETONES UR: NEGATIVE mg/dL
NITRITE: NEGATIVE
PH: 5 (ref 5.0–8.0)
Protein, ur: NEGATIVE mg/dL
RBC / HPF: NONE SEEN RBC/hpf (ref 0–5)
SPECIFIC GRAVITY, URINE: 1.015 (ref 1.005–1.030)

## 2015-05-20 LAB — URINE DRUG SCREEN, QUALITATIVE (ARMC ONLY)
Amphetamines, Ur Screen: NOT DETECTED
BARBITURATES, UR SCREEN: NOT DETECTED
BENZODIAZEPINE, UR SCRN: NOT DETECTED
CANNABINOID 50 NG, UR ~~LOC~~: NOT DETECTED
COCAINE METABOLITE, UR ~~LOC~~: NOT DETECTED
MDMA (Ecstasy)Ur Screen: NOT DETECTED
Methadone Scn, Ur: NOT DETECTED
OPIATE, UR SCREEN: NOT DETECTED
PHENCYCLIDINE (PCP) UR S: NOT DETECTED
Tricyclic, Ur Screen: POSITIVE — AB

## 2015-05-20 LAB — CBC WITH DIFFERENTIAL/PLATELET
BASOS ABS: 0 10*3/uL (ref 0–0.1)
BASOS PCT: 0 %
EOS PCT: 1 %
Eosinophils Absolute: 0 10*3/uL (ref 0–0.7)
HCT: 38.6 % (ref 35.0–47.0)
Hemoglobin: 13.3 g/dL (ref 12.0–16.0)
LYMPHS PCT: 20 %
Lymphs Abs: 1.2 10*3/uL (ref 1.0–3.6)
MCH: 31 pg (ref 26.0–34.0)
MCHC: 34.4 g/dL (ref 32.0–36.0)
MCV: 90.3 fL (ref 80.0–100.0)
Monocytes Absolute: 0.5 10*3/uL (ref 0.2–0.9)
Monocytes Relative: 8 %
NEUTROS ABS: 4.2 10*3/uL (ref 1.4–6.5)
Neutrophils Relative %: 71 %
PLATELETS: 208 10*3/uL (ref 150–440)
RBC: 4.28 MIL/uL (ref 3.80–5.20)
RDW: 14 % (ref 11.5–14.5)
WBC: 5.9 10*3/uL (ref 3.6–11.0)

## 2015-05-20 LAB — BASIC METABOLIC PANEL
ANION GAP: 11 (ref 5–15)
BUN: 10 mg/dL (ref 6–20)
CO2: 22 mmol/L (ref 22–32)
Calcium: 10.1 mg/dL (ref 8.9–10.3)
Chloride: 108 mmol/L (ref 101–111)
Creatinine, Ser: 0.92 mg/dL (ref 0.44–1.00)
GLUCOSE: 106 mg/dL — AB (ref 65–99)
POTASSIUM: 4.2 mmol/L (ref 3.5–5.1)
Sodium: 141 mmol/L (ref 135–145)

## 2015-05-20 LAB — ACETAMINOPHEN LEVEL: Acetaminophen (Tylenol), Serum: <10 ug/mL — ABNORMAL LOW (ref 10–30)

## 2015-05-20 LAB — SALICYLATE LEVEL: Salicylate Lvl: <4 mg/dL (ref 2.8–30.0)

## 2015-05-20 MED ORDER — DIPHENHYDRAMINE HCL 50 MG/ML IJ SOLN
50.0000 mg | Freq: Once | INTRAMUSCULAR | Status: AC
  Administered 2015-05-20: 50 mg via INTRAMUSCULAR

## 2015-05-20 MED ORDER — TRAZODONE HCL 50 MG PO TABS
25.0000 mg | ORAL_TABLET | Freq: Every day | ORAL | Status: DC
  Administered 2015-05-20: 25 mg via ORAL
  Filled 2015-05-20: qty 1

## 2015-05-20 MED ORDER — GABAPENTIN 400 MG PO CAPS
1200.0000 mg | ORAL_CAPSULE | Freq: Every day | ORAL | Status: DC
  Administered 2015-05-20: 1200 mg via ORAL
  Filled 2015-05-20: qty 3

## 2015-05-20 MED ORDER — LORAZEPAM 2 MG/ML IJ SOLN
2.0000 mg | Freq: Once | INTRAMUSCULAR | Status: AC
  Administered 2015-05-20: 2 mg via INTRAVENOUS

## 2015-05-20 MED ORDER — QUETIAPINE FUMARATE 200 MG PO TABS
800.0000 mg | ORAL_TABLET | Freq: Every day | ORAL | Status: DC
  Administered 2015-05-20: 800 mg via ORAL
  Filled 2015-05-20: qty 4

## 2015-05-20 MED ORDER — QUETIAPINE FUMARATE 25 MG PO TABS: 100.0000 mg | ORAL_TABLET | Freq: Every day | ORAL | Status: DC | PRN

## 2015-05-20 MED ORDER — HALOPERIDOL LACTATE 5 MG/ML IJ SOLN
5.0000 mg | Freq: Once | INTRAMUSCULAR | Status: AC
  Administered 2015-05-20: 5 mg via INTRAMUSCULAR

## 2015-05-20 MED ORDER — MELATONIN 5 MG PO TABS: 1.0000 | ORAL_TABLET | Freq: Every evening | ORAL | Status: DC | PRN

## 2015-05-20 MED ORDER — DIVALPROEX SODIUM 500 MG PO DR TAB
500.0000 mg | DELAYED_RELEASE_TABLET | Freq: Two times a day (BID) | ORAL | Status: DC
  Administered 2015-05-20 – 2015-05-21 (×2): 500 mg via ORAL
  Filled 2015-05-20 (×2): qty 1

## 2015-05-20 NOTE — ED Notes (Signed)
Group home coordinator Levora Dredge ) returned and asked for suitcase and patients medical records to be returned to her to take back to group home. She was given suitcase and patients medical records she brought in to take back to group home
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
## 2015-05-20 NOTE — Progress Notes (Signed)
PHARMACIST - PHYSICIAN ORDER COMMUNICATION  CONCERNING: P&T Medication Policy on Herbal Medications  DESCRIPTION:  This patient's order for:  melatonin  has been noted.  This product(s) is classified as an "herbal" or natural product. Due to a lack of definitive safety studies or FDA approval, nonstandard manufacturing practices, plus the potential risk of unknown drug-drug interactions while on inpatient medications, the Pharmacy and Therapeutics Committee does not permit the use of "herbal" or natural products of this type within [place].   ACTION TAKEN: The pharmacy department is unable to verify this order at this time. Please reevaluate patient's clinical condition at discharge and address if the herbal or natural product(s) should be resumed at that time.   
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
## 2015-05-20 NOTE — ED Notes (Signed)
Patient is now calm and cooperative. Patient is keeping clothes on and resting in bed.
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
## 2015-05-20 NOTE — ED Notes (Signed)
On Call Coordinator Laurence Aly [phone]  [place] [phone] Cell                                 [phone] Home

## 2015-05-21 DIAGNOSIS — F6089 Other specific personality disorders: Secondary | ICD-10-CM

## 2015-05-21 NOTE — Progress Notes (Signed)
LCSW received a call back from patients guardian she was hoping patient would stay longer. LCSW explained psychiatrist assessed her and she did not meet the criteria for admission and will need to be picked up by Group Home. LCSW called and informed staff that patient is discharged and ready for pick up.  Maurilio Lovely LCSW [phone]

## 2015-05-21 NOTE — ED Provider Notes (Signed)
-----------------------------------------   6:58 AM on 05/21/2015 -----------------------------------------   Blood pressure 149/101, pulse 136, temperature 98.6 F (37 C), temperature source Oral, resp. rate 22, SpO2 97 %.  The patient had no acute events since last update.  Calm and cooperative at this time.  Disposition is pending per Psychiatry/Behavioral Medicine team recommendations.     Irean Hong, MD 05/21/15 [phone]

## 2015-05-21 NOTE — ED Notes (Signed)
Pt out into hallway numerous times. Pt asking for paper and then taking paper and tearing it into smaller pieces. Pt asking to eat every few mins. Pt was given a snack and explained to her that after snack time the next meal would be breakfast.

## 2015-05-21 NOTE — ED Notes (Signed)
BEHAVIORAL HEALTH ROUNDING Patient sleeping: Yes.   Patient alert and oriented: not applicable SLEEPING Behavior appropriate: Yes.  ; If no, describe: SLEEPING Nutrition and fluids offered: No SLEEPING Toileting and hygiene offered: NoSLEEPING Sitter present: not applicable, Q 15 min safety rounds and observation. Law enforcement present: Yes ODS 

## 2015-05-21 NOTE — ED Notes (Signed)
ENVIRONMENTAL ASSESSMENT  Potentially harmful objects out of patient reach: Yes.  Personal belongings secured: Yes.  Patient dressed in hospital provided attire only: Yes.  Plastic bags out of patient reach: Yes.  Patient care equipment (cords, cables, call bells, lines, and drains) shortened, removed, or accounted for: Yes.  Equipment and supplies removed from bottom of stretcher: Yes.  Potentially toxic materials out of patient reach: Yes.  Sharps container removed or out of patient reach: Yes.   BEHAVIORAL HEALTH ROUNDING Patient sleeping: Yes.   Patient alert and oriented: not applicable SLEEPING Behavior appropriate: Yes.  ; If no, describe: SLEEPING Nutrition and fluids offered: No SLEEPING Toileting and hygiene offered: NoSLEEPING Sitter present: not applicable, Q 15 min safety rounds and observation. Law enforcement present: Yes ODS  ED BHU PLACEMENT JUSTIFICATION  Is the patient under IVC or is there intent for IVC: Yes.  Is the patient medically cleared: Yes.  Is there vacancy in the ED BHU: Yes.  Is the population mix appropriate for patient: no Is the patient awaiting placement in inpatient or outpatient setting: Yes.  Has the patient had a psychiatric consult: no Survey of unit performed for contraband, proper placement and condition of furniture, tampering with fixtures in bathroom, shower, and each patient room: Yes. ; Findings: All clear  APPEARANCE/BEHAVIOR  Restless, pacing,  NEURO ASSESSMENT  Orientation: time, place and person per baseline. 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  Hallucinations: No.None noted (Hallucinations)  Speech: Normal  Gait: normal  RESPIRATORY ASSESSMENT  WNL  CARDIOVASCULAR ASSESSMENT  WNL  GASTROINTESTINAL ASSESSMENT  WNL  EXTREMITIES  WNL  PLAN OF CARE  Provide calm/safe environment. Vital signs assessed twice daily. ED BHU Assessment once each 12-hour shift. Collaborate with intake RN daily or as condition indicates. Assure the ED  provider has rounded once each shift. Provide and encourage hygiene. Provide redirection as needed. Assess for escalating behavior; address immediately and inform ED provider.  Assess family dynamic and appropriateness for visitation as needed: Yes. ; If necessary, describe findings:  Educate the patient/family about BHU procedures/visitation: Yes. ; If necessary, describe findings: Pt is calm and cooperative at this time. Pt understanding and accepting of unit procedures/rules. Will continue to monitor with Q 15 min safety rounds and observation.

## 2015-05-21 NOTE — Consult Note (Signed)
[facility] Psychiatry Consult   Reason for Consult:  Consult for this 25 year old woman with moderate mental retardation brought here from her group home because of agitated behavior Referring Physician:  Mariea Clonts Patient Identification: Monica [name] MRN:  [id number] Principal Diagnosis: Aggression Diagnosis:   Patient Active Problem List   Diagnosis Date Noted  . Aggression [F60.89] 05/03/2015  . Intellectual disability [F79] 05/03/2015    Total Time spent with patient: 45 minutes  Subjective:   Monica [name] is a 25 y.o. female patient admitted with patient was not able to articulate any complaint.  HPI:  Attempted to interview patient but she is not cooperative. Chart reviewed. Old chart reviewed labs reviewed. 25 year old woman with moderate mental retardation brought here from her group home. Notes indicate that she was reported to of been agitated yesterday and aggressive with people at her group home and escalated when they tried to subdue her. She was described as being agitated when she came to the emergency room. The patient is not able to give any history or report anything about her current mental state. She is prescribed medication for mood stabilization and and it's unclear if that has been taken recently. No evidence or reason to think there's been any substance abuse. Unknown whether there is been any other new stress. On attempted interview today the patient has been occasionally irritable usually when she doesn't get her way but has not been violent.  Social history: Patient resides at [facility]. It appears from what I am told that she does not have a legal guardian which seems remarkable for someone clearly without any capacity  Medical history: No known medical problems outside the behavioral  Substance abuse history: Nonidentified  Past Psychiatric History: Patient has been to our emergency room before with similar problems. Patient seems to get escalated  quickly especially when she is not getting her way. Also appears to escalate when a show of force was then involved but is usually redirectable. Patient has moderate mental retardation and is not able to communicate verbally in an effective manner. She is not appropriate for admission to our psychiatric ward. There is no evidence of her being dangerous to herself and her behavior with others should be controllable with appropriate staffing.  Risk to Self: Suicidal Ideation: No Suicidal Intent: No Is patient at risk for suicide?: No Suicidal Plan?: No Access to Means: No What has been your use of drugs/alcohol within the last 12 months?: None Reported How many times?: 0 Other Self Harm Risks: None Reported Triggers for Past Attempts: None known Intentional Self Injurious Behavior: None Risk to Others: Homicidal Ideation: No Thoughts of Harm to Others: No Current Homicidal Intent: No Current Homicidal Plan: No Access to Homicidal Means: No Identified Victim: None Reported History of harm to others?: Yes Assessment of Violence: On admission Violent Behavior Description: [place] staff & Residents Does patient have access to weapons?: No Criminal Charges Pending?: No Does patient have a court date: No Prior Inpatient Therapy: Prior Inpatient Therapy: Yes Prior Therapy Dates: 2009 Prior Therapy Facilty/Provider(s): Unknown Reason for Treatment: Behavioral Problems-MR/IDD Prior Outpatient Therapy: Prior Outpatient Therapy: Yes Prior Therapy Dates: Currently Prior Therapy Facilty/Provider(s): Dr. Agapito Games ([place]) Reason for Treatment: MR/IDD Does patient have an ACCT team?: No Does patient have Intensive In-House Services?  : No Does patient have [facility] services? : No Does patient have P4CC services?: No  Past Medical History:  Past Medical History  Diagnosis Date  . Mentally disabled   .  Seizures ([place])   . Schizo affective schizophrenia ([place])     History reviewed. No pertinent past surgical history. Family History: History reviewed. No pertinent family history. Family Psychiatric  History: Unknown Social History:  History  Alcohol Use No     History  Drug Use No    Social History   Social History  . Marital Status: Unknown    Spouse Name: N/A  . Number of Children: N/A  . Years of Education: N/A   Social History Main Topics  . Smoking status: Never Smoker   . Smokeless tobacco: Never Used  . Alcohol Use: No  . Drug Use: No  . Sexual Activity: Not Asked   Other Topics Concern  . None   Social History Narrative   Additional Social History:    Allergies:  No Known Allergies  Labs:  Results for orders placed or performed during the hospital encounter of 05/20/15 (from the past 48 hour(s))  CBC with Differential     Status: None   Collection Time: 05/20/15  6:12 PM  Result Value Ref Range   WBC 5.9 3.6 - 11.0 K/uL   RBC 4.28 3.80 - 5.20 MIL/uL   Hemoglobin 13.3 12.0 - 16.0 g/dL   HCT 38.6 35.0 - 47.0 %   MCV 90.3 80.0 - 100.0 fL   MCH 31.0 26.0 - 34.0 pg   MCHC 34.4 32.0 - 36.0 g/dL   RDW 14.0 11.5 - 14.5 %   Platelets 208 150 - 440 K/uL   Neutrophils Relative % 71 %   Neutro Abs 4.2 1.4 - 6.5 K/uL   Lymphocytes Relative 20 %   Lymphs Abs 1.2 1.0 - 3.6 K/uL   Monocytes Relative 8 %   Monocytes Absolute 0.5 0.2 - 0.9 K/uL   Eosinophils Relative 1 %   Eosinophils Absolute 0.0 0 - 0.7 K/uL   Basophils Relative 0 %   Basophils Absolute 0.0 0 - 0.1 K/uL  Basic metabolic panel     Status: Abnormal   Collection Time: 05/20/15  6:12 PM  Result Value Ref Range   Sodium 141 135 - 145 mmol/L   Potassium 4.2 3.5 - 5.1 mmol/L   Chloride 108 101 - 111 mmol/L   CO2 22 22 - 32 mmol/L   Glucose, Bld 106 (H) 65 - 99 mg/dL   BUN 10 6 - 20 mg/dL   Creatinine, Ser 0.92 0.44 - 1.00 mg/dL   Calcium 10.1 8.9 - 10.3 mg/dL   GFR calc non Af Amer >60 >60 mL/min   GFR calc Af Amer >60 >60 mL/min    Comment: (NOTE) The  eGFR has been calculated using the CKD EPI equation. This calculation has not been validated in all clinical situations. eGFR's persistently <60 mL/min signify possible Chronic Kidney Disease.    Anion gap 11 5 - 15  Acetaminophen level     Status: Abnormal   Collection Time: 05/20/15  6:12 PM  Result Value Ref Range   Acetaminophen (Tylenol), Serum <10 (L) 10 - 30 ug/mL    Comment:        THERAPEUTIC CONCENTRATIONS VARY SIGNIFICANTLY. A RANGE OF 10-30 ug/mL MAY BE AN EFFECTIVE CONCENTRATION FOR MANY PATIENTS. HOWEVER, SOME ARE BEST TREATED AT CONCENTRATIONS OUTSIDE THIS RANGE. ACETAMINOPHEN CONCENTRATIONS >150 ug/mL AT 4 HOURS AFTER INGESTION AND >50 ug/mL AT 12 HOURS AFTER INGESTION ARE OFTEN ASSOCIATED WITH TOXIC REACTIONS.   Salicylate level     Status: None   Collection Time: 05/20/15  6:12 PM  Result Value Ref Range   Salicylate Lvl <7.5 2.8 - 30.0 mg/dL  Urinalysis complete, with microscopic ([facility] only)     Status: Abnormal   Collection Time: 05/20/15  9:03 PM  Result Value Ref Range   Color, Urine YELLOW (A) YELLOW   APPearance CLEAR (A) CLEAR   Glucose, UA NEGATIVE NEGATIVE mg/dL   Bilirubin Urine NEGATIVE NEGATIVE   Ketones, ur NEGATIVE NEGATIVE mg/dL   Specific Gravity, Urine 1.015 1.005 - 1.030   Hgb urine dipstick NEGATIVE NEGATIVE   pH 5.0 5.0 - 8.0   Protein, ur NEGATIVE NEGATIVE mg/dL   Nitrite NEGATIVE NEGATIVE   Leukocytes, UA 1+ (A) NEGATIVE   RBC / HPF NONE SEEN 0 - 5 RBC/hpf   WBC, UA 6-30 0 - 5 WBC/hpf   Bacteria, UA NONE SEEN NONE SEEN   Squamous Epithelial / LPF 0-5 (A) NONE SEEN   Mucous PRESENT   Urine Drug Screen, Qualitative ([facility] only)     Status: Abnormal   Collection Time: 05/20/15  9:03 PM  Result Value Ref Range   Tricyclic, Ur Screen POSITIVE (A) NONE DETECTED   Amphetamines, Ur Screen NONE DETECTED NONE DETECTED   MDMA (Ecstasy)Ur Screen NONE DETECTED NONE DETECTED   Cocaine Metabolite,Ur [place] NONE DETECTED NONE DETECTED    Opiate, Ur Screen NONE DETECTED NONE DETECTED   Phencyclidine (PCP) Ur S NONE DETECTED NONE DETECTED   Cannabinoid 50 Ng, Ur [place] NONE DETECTED NONE DETECTED   Barbiturates, Ur Screen NONE DETECTED NONE DETECTED   Benzodiazepine, Ur Scrn NONE DETECTED NONE DETECTED   Methadone Scn, Ur NONE DETECTED NONE DETECTED    Comment: (NOTE) 436  Tricyclics, urine               Cutoff 1000 ng/mL 200  Amphetamines, urine             Cutoff 1000 ng/mL 300  MDMA (Ecstasy), urine           Cutoff 500 ng/mL 400  Cocaine Metabolite, urine       Cutoff 300 ng/mL 500  Opiate, urine                   Cutoff 300 ng/mL 600  Phencyclidine (PCP), urine      Cutoff 25 ng/mL 700  Cannabinoid, urine              Cutoff 50 ng/mL 800  Barbiturates, urine             Cutoff 200 ng/mL 900  Benzodiazepine, urine           Cutoff 200 ng/mL 1000 Methadone, urine                Cutoff 300 ng/mL 1100 1200 The urine drug screen provides only a preliminary, unconfirmed 1300 analytical test result and should not be used for non-medical 1400 purposes. Clinical consideration and professional judgment should 1500 be applied to any positive drug screen result due to possible 1600 interfering substances. A more specific alternate chemical method 1700 must be used in order to obtain a confirmed analytical result.  1800 Gas chromato graphy / mass spectrometry (GC/MS) is the preferred 1900 confirmatory method.   Urine culture     Status: None (Preliminary result)   Collection Time: 05/20/15  9:03 PM  Result Value Ref Range   Specimen Description URINE, RANDOM    Special Requests NONE    Culture NO GROWTH < 12 HOURS    Report Status PENDING  Pregnancy, urine POC     Status: None   Collection Time: 05/20/15  9:07 PM  Result Value Ref Range   Preg Test, Ur NEGATIVE NEGATIVE    Comment:        THE SENSITIVITY OF THIS METHODOLOGY IS >24 mIU/mL     Current Facility-Administered Medications  Medication Dose Route Frequency  Provider Last Rate Last Dose  . divalproex (DEPAKOTE) DR tablet 500 mg  500 mg Oral BID Nance Pear, MD   500 mg at 05/21/15 1026  . gabapentin (NEURONTIN) capsule 1,200 mg  1,200 mg Oral QHS Nance Pear, MD   1,200 mg at 05/20/15 2257  . QUEtiapine (SEROQUEL) tablet 100 mg  100 mg Oral Daily PRN Nance Pear, MD      . QUEtiapine (SEROQUEL) tablet 800 mg  800 mg Oral QHS Nance Pear, MD   800 mg at 05/20/15 2255  . traZODone (DESYREL) tablet 25 mg  25 mg Oral QHS Nance Pear, MD   25 mg at 05/20/15 2257   Current Outpatient Prescriptions  Medication Sig Dispense Refill  . desonide (DESOWEN) 0.05 % cream Apply 1 application topically 2 (two) times daily.    . divalproex (DEPAKOTE) 500 MG DR tablet Take 500 mg by mouth 2 (two) times daily.    Marland Kitchen gabapentin (NEURONTIN) 300 MG capsule Take 1,200 mg by mouth at bedtime.    Marland Kitchen ibuprofen (ADVIL,MOTRIN) 600 MG tablet Take 1 tablet (600 mg total) by mouth every 6 (six) hours as needed. 30 tablet 0  . ketoconazole (NIZORAL) 2 % cream Apply 1 application topically daily.    Marland Kitchen ketoconazole (NIZORAL) 2 % shampoo Apply 1 application topically 2 (two) times a week.    . Melatonin 5 MG TABS Take 1 tablet by mouth at bedtime as needed.    . polyethylene glycol (MIRALAX / GLYCOLAX) packet Take 17 g by mouth daily.    . QUEtiapine (SEROQUEL) 100 MG tablet Take 100 mg by mouth daily as needed.    Marland Kitchen QUEtiapine (SEROQUEL) 400 MG tablet Take 800 mg by mouth at bedtime.    . traZODone (DESYREL) 50 MG tablet Take 25 mg by mouth at bedtime.      Musculoskeletal: Strength & Muscle Tone: within normal limits Gait & Station: broad based Patient leans: N/A  Psychiatric Specialty Exam: Review of Systems  Unable to perform ROS: mental acuity    Blood pressure 139/87, pulse 107, temperature 98.7 F (37.1 C), temperature source Oral, resp. rate 16, SpO2 97 %.There is no weight on file to calculate BMI.  General Appearance: Disheveled  Eye Contact::   Minimal  Speech:  Garbled and Slurred  Volume:  Decreased  Mood:  Dysphoric  Affect:  Congruent  Thought Process:  Disorganized  Orientation:  Negative  Thought Content:  Negative  Suicidal Thoughts:  No  Homicidal Thoughts:  No  Memory:  Negative  Judgement:  Negative  Insight:  Negative  Psychomotor Activity:  Psychomotor Retardation  Concentration:  Poor  Recall:  Poor  Fund of Knowledge:Poor  Language: Poor  Akathisia:  No  Handed:  Right  AIMS (if indicated):     Assets:  Housing Physical Health Social Support  ADL's:  Impaired  Cognition: Impaired,  Moderate and Severe  Sleep:      Treatment Plan Summary: Plan 25 year old woman with mental retardation and behavior problems. Patient is not dangerous to herself. Has had some outbursts. Will not benefit from psychiatric hospitalization. Patient does not meet commitment criteria. Paperwork discontinued.  Case reviewed with emergency room doctor. Recommend patient be discharged back to her group home setting and that the outpatient treatment they have in place be reevaluated. No other indication for any psychiatric treatment at this point.  Disposition: Patient does not meet criteria for psychiatric inpatient admission.  Alethia Berthold, MD 05/21/2015 3:33 PM

## 2015-05-21 NOTE — Progress Notes (Signed)
In consultation LCSW was informed by Dr Toni Amend the patient is to return home.Called [facility] [phone] Patient denies that she is suicidal or homicidal.

## 2015-05-21 NOTE — Progress Notes (Signed)
LCSW called the [facility] [phone] and spoke to Detrice who will advise Monica [name] patient is discharged and ready for pick up.  Willa Brocks LCSW

## 2015-05-21 NOTE — ED Notes (Addendum)
Called and updated Lupita Leash Banker) on pt's status and discharge

## 2015-05-21 NOTE — ED Notes (Signed)
Spoke with Monica [name] On call Care Coordinator with [name] who reports pt has a Behavior Support Plan in place and that the patient asking for paper is one of the main behaviors that they work to control with the patient. State pt is allowed to have 1 to 3 pieces of paper 3 times per day if she is doing the things she needs to do. Ms. Monica [name] will fax a copy of this plan to be placed on patients chart. Medications reconciled with her also at this time.

## 2015-05-22 LAB — URINE CULTURE: Culture: 20000

## 2015-06-07 ENCOUNTER — Encounter: Payer: Self-pay | Admitting: *Deleted

## 2015-06-07 ENCOUNTER — Emergency Department
Admission: EM | Admit: 2015-06-07 | Discharge: 2015-06-11 | Disposition: A | Discharge status: Other Acute Inpatient Hospital | Payer: Medicaid Other | Attending: Emergency Medicine | Admitting: Emergency Medicine

## 2015-06-07 DIAGNOSIS — F79 Unspecified intellectual disabilities: Secondary | ICD-10-CM

## 2015-06-07 DIAGNOSIS — R456 Violent behavior: Secondary | ICD-10-CM | POA: Insufficient documentation

## 2015-06-07 DIAGNOSIS — Z79899 Other long term (current) drug therapy: Secondary | ICD-10-CM | POA: Diagnosis not present

## 2015-06-07 DIAGNOSIS — Z3202 Encounter for pregnancy test, result negative: Secondary | ICD-10-CM | POA: Insufficient documentation

## 2015-06-07 DIAGNOSIS — R4689 Other symptoms and signs involving appearance and behavior: Secondary | ICD-10-CM | POA: Diagnosis present

## 2015-06-07 DIAGNOSIS — F911 Conduct disorder, childhood-onset type: Secondary | ICD-10-CM | POA: Diagnosis present

## 2015-06-07 DIAGNOSIS — F1310 Sedative, hypnotic or anxiolytic abuse, uncomplicated: Secondary | ICD-10-CM | POA: Diagnosis not present

## 2015-06-07 LAB — URINE DRUG SCREEN, QUALITATIVE (ARMC ONLY)
AMPHETAMINES, UR SCREEN: NOT DETECTED
BARBITURATES, UR SCREEN: NOT DETECTED
BENZODIAZEPINE, UR SCRN: NOT DETECTED
Cannabinoid 50 Ng, Ur ~~LOC~~: NOT DETECTED
Cocaine Metabolite,Ur ~~LOC~~: NOT DETECTED
MDMA (Ecstasy)Ur Screen: NOT DETECTED
METHADONE SCREEN, URINE: NOT DETECTED
Opiate, Ur Screen: NOT DETECTED
Phencyclidine (PCP) Ur S: NOT DETECTED
TRICYCLIC, UR SCREEN: POSITIVE — AB

## 2015-06-07 LAB — CBC WITH DIFFERENTIAL/PLATELET
Basophils Absolute: 0 10*3/uL (ref 0–0.1)
Basophils Relative: 0 %
EOS PCT: 0 %
Eosinophils Absolute: 0 10*3/uL (ref 0–0.7)
HCT: 38.1 % (ref 35.0–47.0)
HEMOGLOBIN: 13.1 g/dL (ref 12.0–16.0)
LYMPHS ABS: 1.7 10*3/uL (ref 1.0–3.6)
LYMPHS PCT: 50 %
MCH: 31.3 pg (ref 26.0–34.0)
MCHC: 34.4 g/dL (ref 32.0–36.0)
MCV: 90.9 fL (ref 80.0–100.0)
MONOS PCT: 11 %
Monocytes Absolute: 0.4 10*3/uL (ref 0.2–0.9)
NEUTROS PCT: 39 %
Neutro Abs: 1.3 10*3/uL — ABNORMAL LOW (ref 1.4–6.5)
Platelets: 190 10*3/uL (ref 150–440)
RBC: 4.19 MIL/uL (ref 3.80–5.20)
RDW: 14.9 % — ABNORMAL HIGH (ref 11.5–14.5)
WBC: 3.4 10*3/uL — AB (ref 3.6–11.0)

## 2015-06-07 LAB — URINALYSIS COMPLETE WITH MICROSCOPIC (ARMC ONLY)
BACTERIA UA: NONE SEEN
BILIRUBIN URINE: NEGATIVE
Glucose, UA: NEGATIVE mg/dL
HGB URINE DIPSTICK: NEGATIVE
Ketones, ur: NEGATIVE mg/dL
LEUKOCYTES UA: NEGATIVE
Nitrite: NEGATIVE
PH: 6 (ref 5.0–8.0)
PROTEIN: NEGATIVE mg/dL
Specific Gravity, Urine: 1.014 (ref 1.005–1.030)

## 2015-06-07 LAB — COMPREHENSIVE METABOLIC PANEL
ALT: 21 U/L (ref 14–54)
AST: 23 U/L (ref 15–41)
Albumin: 4.7 g/dL (ref 3.5–5.0)
Alkaline Phosphatase: 71 U/L (ref 38–126)
Anion gap: 8 (ref 5–15)
BUN: 12 mg/dL (ref 6–20)
CHLORIDE: 101 mmol/L (ref 101–111)
CO2: 28 mmol/L (ref 22–32)
CREATININE: 0.94 mg/dL (ref 0.44–1.00)
Calcium: 9.9 mg/dL (ref 8.9–10.3)
GFR calc Af Amer: >60 mL/min (ref >60)
GFR calc non Af Amer: >60 mL/min (ref >60)
Glucose, Bld: 141 mg/dL — ABNORMAL HIGH (ref 65–99)
POTASSIUM: 4 mmol/L (ref 3.5–5.1)
SODIUM: 137 mmol/L (ref 135–145)
Total Bilirubin: 0.4 mg/dL (ref 0.3–1.2)
Total Protein: 8.8 g/dL — ABNORMAL HIGH (ref 6.5–8.1)

## 2015-06-07 LAB — ACETAMINOPHEN LEVEL: Acetaminophen (Tylenol), Serum: <10 ug/mL — ABNORMAL LOW (ref 10–30)

## 2015-06-07 LAB — PREGNANCY, URINE: Preg Test, Ur: NEGATIVE

## 2015-06-07 MED ORDER — LORAZEPAM 2 MG/ML IJ SOLN
2.0000 mg | Freq: Once | INTRAMUSCULAR | Status: AC
  Administered 2015-06-07: 2 mg via INTRAVENOUS

## 2015-06-07 MED ORDER — HALOPERIDOL LACTATE 5 MG/ML IJ SOLN
INTRAMUSCULAR | Status: AC
  Administered 2015-06-07: 5 mg via INTRAMUSCULAR
  Filled 2015-06-07: qty 1

## 2015-06-07 MED ORDER — HALOPERIDOL LACTATE 5 MG/ML IJ SOLN
5.0000 mg | Freq: Once | INTRAMUSCULAR | Status: AC
  Administered 2015-06-07: 5 mg via INTRAMUSCULAR

## 2015-06-07 MED ORDER — LORAZEPAM 2 MG/ML IJ SOLN
INTRAMUSCULAR | Status: AC
  Administered 2015-06-07: 2 mg via INTRAVENOUS
  Filled 2015-06-07: qty 1

## 2015-06-07 NOTE — ED Notes (Signed)
Pts group home staff member reports to not give pt more than 3 pieces of paper 3 times a day. Pt will reportedly become anxious and continue to decline if paper is supplied constantly.

## 2015-06-07 NOTE — ED Notes (Signed)
MD asked if blood and urine could be collected at a later time once pt has calmed down and is not distress. MD verbalized that would be okay.

## 2015-06-07 NOTE — ED Notes (Signed)
Negative pregnancy test

## 2015-06-07 NOTE — ED Notes (Signed)
After speaking with police and group home it appears that pt has been IVCd and broguht to ED due to aggressive and irratic behavior while at the group home. Pt is reported to have been d/c from behavioral medicine at [facility] yesterday and has been aggressive toward staff and throwing furniture since. Group home staff reports pt has been attempting to leave home multiple times and has been hitting staff.

## 2015-06-07 NOTE — ED Provider Notes (Signed)
[facility] Emergency Department Provider Note  ____________________________________________  Time seen: Approximately 5:57 PM  I have reviewed the triage vital signs and the nursing notes.   HISTORY  Chief Complaint Aggressive Behavior  Violent at a group home  HPI Monica [name] is a 25 y.o. female patient recently discharged back to group home there she has become violent she is throwing her radio and furniture out of her room into the hallway trying to hit staff. She has been locking her door trying to block the doors to the group home she has been locking herself in the [place] and banging on the windows as she was going home she appears to be out of control. Here in the emergency room she is yelling and screaming and wanting more paper she usually gets 3 pieces of paper 3 times a day and tears them into shreds and scattered some all over the place. She has had several pieces of paper now at least 10 and she is demanding more and screaming. I am unable to get any history out of the patient the history above." From group home staff.   Past Medical History  Diagnosis Date  . Mentally disabled   . Seizures (HCC)   . Schizo affective schizophrenia [facility])     Patient Active Problem List   Diagnosis Date Noted  . Aggression 05/03/2015  . Intellectual disability 05/03/2015    History reviewed. No pertinent past surgical history.  Current Outpatient Rx  Name  Route  Sig  Dispense  Refill  . desonide (DESOWEN) 0.05 % cream   Topical   Apply 1 application topically 2 (two) times daily.         . divalproex (DEPAKOTE) 500 MG DR tablet   Oral   Take 500 mg by mouth 2 (two) times daily.         Marland Kitchen gabapentin (NEURONTIN) 300 MG capsule   Oral   Take 1,200 mg by mouth at bedtime.         Marland Kitchen ibuprofen (ADVIL,MOTRIN) 600 MG tablet   Oral   Take 1 tablet (600 mg total) by mouth every 6 (six) hours as needed.   30 tablet   0   . ketoconazole (NIZORAL) 2  % cream   Topical   Apply 1 application topically daily.         Marland Kitchen ketoconazole (NIZORAL) 2 % shampoo   Topical   Apply 1 application topically 2 (two) times a week.         . Melatonin 5 MG TABS   Oral   Take 1 tablet by mouth at bedtime as needed.         . polyethylene glycol (MIRALAX / GLYCOLAX) packet   Oral   Take 17 g by mouth daily.         . QUEtiapine (SEROQUEL) 100 MG tablet   Oral   Take 100 mg by mouth daily as needed.         Marland Kitchen QUEtiapine (SEROQUEL) 400 MG tablet   Oral   Take 800 mg by mouth at bedtime.         . traZODone (DESYREL) 50 MG tablet   Oral   Take 25 mg by mouth at bedtime.           Allergies Review of patient's allergies indicates no known allergies.  History reviewed. No pertinent family history.  Social History Social History  Substance Use Topics  . Smoking status: Never Smoker   .  Smokeless tobacco: Never Used  . Alcohol Use: No    Review of Systems Constitutional: No fever/chills Eyes: No visual changes. ENT: No sore throat. Cardiovascular: Denies chest pain. Respiratory: Denies shortness of breath. Gastrointestinal: No abdominal pain.  No nausea, no vomiting.  No diarrhea.  No constipation. Genitourinary: Negative for dysuria. Musculoskeletal: Negative for back pain. Skin: Negative for rash. Neurological: Negative for headaches, focal weakness or numbness.  10-point ROS otherwise negative.  ____________________________________________   PHYSICAL EXAM:  VITAL SIGNS: ED Triage Vitals  Enc Vitals Group     BP --      Pulse --      Resp --      Temp --      Temp src --      SpO2 --      Weight --      Height --      Head Cir --      Peak Flow --      Pain Score --      Pain Loc --      Pain Edu? --    Constitutional: Alert patient in room yelling and screaming and carrying on insisting on getting more paper Eyes: Conjunctivae are normal. PERRL. EOMI. Head: Atraumatic. Nose: No  congestion/rhinnorhea. Mouth/Throat: Mucous membranes are moist.  Oropharynx non-erythematous. Neck: No stridor.   Cardiovascular: Normal rate, regular rhythm. Grossly normal heart sounds difficult examination due to patient not cooperating Respiratory: Normal respiratory effort.  No retractions. Lungs CTAB. Gastrointestinal: Soft and nontender. No distention. No abdominal bruits. No CVA tenderness. Musculoskeletal: No lower extremity tenderness nor edema.  No joint effusions. Neurologic:  Normal speech and language. No gross focal neurologic deficits are appreciated. No gait instability.  ____________________________________________   LABS (all labs ordered are listed, but only abnormal results are displayed)  Labs Reviewed  URINALYSIS COMPLETEWITH MICROSCOPIC ([facility] ONLY) - Abnormal; Notable for the following:    Color, Urine YELLOW (*)    APPearance CLEAR (*)    Squamous Epithelial / LPF 0-5 (*)    All other components within normal limits  URINE DRUG SCREEN, QUALITATIVE ([facility] ONLY) - Abnormal; Notable for the following:    Tricyclic, Ur Screen POSITIVE (*)    All other components within normal limits  COMPREHENSIVE METABOLIC PANEL - Abnormal; Notable for the following:    Glucose, Bld 141 (*)    Total Protein 8.8 (*)    All other components within normal limits  CBC WITH DIFFERENTIAL/PLATELET - Abnormal; Notable for the following:    WBC 3.4 (*)    RDW 14.9 (*)    Neutro Abs 1.3 (*)    All other components within normal limits  ACETAMINOPHEN LEVEL - Abnormal; Notable for the following:    Acetaminophen (Tylenol), Serum <10 (*)    All other components within normal limits  PREGNANCY, URINE   ____________________________________________  EKG . ____________________________________________  RADIOLOGY   ____________________________________________   PROCEDURES    ____________________________________________   INITIAL IMPRESSION / ASSESSMENT AND PLAN / ED  COURSE  Pertinent labs & imaging results that were available during my care of the patient were reviewed by me and considered in my medical decision making (see chart for details).   ____________________________________________   FINAL CLINICAL IMPRESSION(S) / ED DIAGNOSES  Final diagnoses:  Mentally disabled      Arnaldo Natal, MD 06/07/15 [phone]

## 2015-06-07 NOTE — ED Notes (Signed)
Pt given paper by ED staff in hopes to calm pt down. Pt is screaming and crying out at this time. Pt refused to sit on bed and continues to try to leave room. Pt will not answer questions unless it is to say she wants paper. Pt continues to request more and more paper and is becoming more and more flustered. MD made aware and staff from group home present throughout process.
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
## 2015-06-08 DIAGNOSIS — F79 Unspecified intellectual disabilities: Secondary | ICD-10-CM

## 2015-06-08 MED ORDER — DOCUSATE SODIUM 100 MG PO CAPS
100.0000 mg | ORAL_CAPSULE | Freq: Every day | ORAL | Status: DC
  Administered 2015-06-08 – 2015-06-11 (×4): 100 mg via ORAL
  Filled 2015-06-08 (×4): qty 1

## 2015-06-08 MED ORDER — PRAZOSIN HCL 1 MG PO CAPS
1.0000 mg | ORAL_CAPSULE | Freq: Every day | ORAL | Status: DC
  Administered 2015-06-08 – 2015-06-10 (×2): 1 mg via ORAL
  Filled 2015-06-08 (×6): qty 1

## 2015-06-08 MED ORDER — LORAZEPAM 2 MG/ML IJ SOLN
INTRAMUSCULAR | Status: AC
  Administered 2015-06-08: 2 mg via INTRAMUSCULAR
  Filled 2015-06-08: qty 1

## 2015-06-08 MED ORDER — METFORMIN HCL 500 MG PO TABS
500.0000 mg | ORAL_TABLET | Freq: Every day | ORAL | Status: DC
  Administered 2015-06-08 – 2015-06-11 (×4): 500 mg via ORAL
  Filled 2015-06-08 (×4): qty 1

## 2015-06-08 MED ORDER — MEDROXYPROGESTERONE ACETATE 150 MG/ML IM SUSP: 150.0000 mg | INTRAMUSCULAR | Status: DC

## 2015-06-08 MED ORDER — LORAZEPAM 2 MG/ML IJ SOLN
2.0000 mg | Freq: Once | INTRAMUSCULAR | Status: AC
  Administered 2015-06-08: 2 mg via INTRAMUSCULAR

## 2015-06-08 MED ORDER — DIPHENHYDRAMINE HCL 50 MG/ML IJ SOLN
INTRAMUSCULAR | Status: AC
  Administered 2015-06-08: 50 mg via INTRAMUSCULAR
  Filled 2015-06-08: qty 1

## 2015-06-08 MED ORDER — POLYETHYLENE GLYCOL 3350 17 G PO PACK
17.0000 g | PACK | Freq: Every day | ORAL | Status: DC
  Administered 2015-06-08: 17 g via ORAL
  Filled 2015-06-08 (×2): qty 1

## 2015-06-08 MED ORDER — HALOPERIDOL LACTATE 5 MG/ML IJ SOLN
INTRAMUSCULAR | Status: AC
  Administered 2015-06-08: 5 mg via INTRAMUSCULAR
  Filled 2015-06-08: qty 1

## 2015-06-08 MED ORDER — QUETIAPINE FUMARATE 300 MG PO TABS
800.0000 mg | ORAL_TABLET | Freq: Every day | ORAL | Status: DC
  Administered 2015-06-08 – 2015-06-10 (×3): 800 mg via ORAL
  Filled 2015-06-08 (×3): qty 1

## 2015-06-08 MED ORDER — SENNA 8.6 MG PO TABS
2.0000 | ORAL_TABLET | Freq: Every evening | ORAL | Status: DC | PRN
  Filled 2015-06-08: qty 2

## 2015-06-08 MED ORDER — LORAZEPAM 2 MG PO TABS
2.0000 mg | ORAL_TABLET | Freq: Once | ORAL | Status: AC
  Administered 2015-06-08: 2 mg via ORAL
  Filled 2015-06-08: qty 1

## 2015-06-08 MED ORDER — HALOPERIDOL LACTATE 5 MG/ML IJ SOLN
5.0000 mg | Freq: Once | INTRAMUSCULAR | Status: AC
  Administered 2015-06-08: 5 mg via INTRAMUSCULAR

## 2015-06-08 MED ORDER — DIVALPROEX SODIUM 500 MG PO DR TAB
500.0000 mg | DELAYED_RELEASE_TABLET | Freq: Two times a day (BID) | ORAL | Status: DC
  Administered 2015-06-08 – 2015-06-11 (×7): 500 mg via ORAL
  Filled 2015-06-08 (×7): qty 1

## 2015-06-08 MED ORDER — GABAPENTIN 300 MG PO CAPS
1200.0000 mg | ORAL_CAPSULE | Freq: Every day | ORAL | Status: DC
Start: 2015-06-08 — End: 2015-06-11
  Administered 2015-06-08 – 2015-06-10 (×3): 1200 mg via ORAL
  Filled 2015-06-08 (×4): qty 4

## 2015-06-08 MED ORDER — TRAZODONE HCL 100 MG PO TABS
100.0000 mg | ORAL_TABLET | Freq: Every day | ORAL | Status: DC
  Administered 2015-06-08 – 2015-06-10 (×3): 100 mg via ORAL
  Filled 2015-06-08 (×3): qty 1

## 2015-06-08 MED ORDER — DIPHENHYDRAMINE HCL 50 MG/ML IJ SOLN
50.0000 mg | Freq: Once | INTRAMUSCULAR | Status: AC
  Administered 2015-06-08: 50 mg via INTRAMUSCULAR

## 2015-06-08 MED ORDER — QUETIAPINE FUMARATE 25 MG PO TABS
50.0000 mg | ORAL_TABLET | Freq: Three times a day (TID) | ORAL | Status: DC | PRN
  Administered 2015-06-08: 50 mg via ORAL
  Filled 2015-06-08 (×2): qty 2
  Filled 2015-06-08: qty 1

## 2015-06-08 NOTE — ED Provider Notes (Signed)
-----------------------------------------   6:48 AM on 06/08/2015 -----------------------------------------   Blood pressure 113/75, pulse 110, temperature 98.5 F (36.9 C), temperature source Oral, resp. rate 16, SpO2 98 %.  The patient had no acute events since last update.  Calm and cooperative at this time.  Disposition is pending per Psychiatry/Behavioral Medicine team recommendations.   Patient became very agitated early this morning and required sedation. Patient was in the hallway yelling and screaming in and had inability to keep her in her room. Patient was upset because she cannot acquire any paper and apparently has a history of trying to eat paper. Patient received Haldol, Ativan, and Benadryl and seemed to become more cooperative.  Jennye MoccasinBrian S Quigley, MD 06/08/15 [phone]

## 2015-06-08 NOTE — ED Notes (Signed)
Breakfast provided  - Patient observed lying in bed with eyes closed  Even, unlabored respirations observed   NAD pt appears to be sleeping  I will continue to monitor along with every 15 minute visual observations and ongoing security monitoring    

## 2015-06-08 NOTE — ED Notes (Signed)
BEHAVIORAL HEALTH ROUNDING Patient sleeping: No. Patient alert and oriented: yes Behavior appropriate: Yes.  ; If no, describe:  Nutrition and fluids offered: yes Toileting and hygiene offered: Yes  Sitter present: q15 minute observations and security  monitoring Law enforcement present: Yes  ODS  

## 2015-06-08 NOTE — BH Assessment (Signed)
Writer Group Home Supervisor (Nanatte Theardore-[phone]) and let her know she is cleared for discharge. She states she have to call the Group Home nurse and arrange for transportation for pick up. Writer provided her with his number, so she can call back.  Writer updated patient's nurse (Amy T.)

## 2015-06-08 NOTE — ED Notes (Signed)

## 2015-06-08 NOTE — ED Notes (Signed)
Pt. Noted to be standing in the hall banging on the door; with later screaming; disrobing; ER-MD was notified and received an order for medication(Ativan 2mg s x 1;.  abnormal behavior noted; with Pt. Continuing to yell and scream loudly. Pt. Presented with having calmed down a short time later;. Will continue to monitor with security cameras. Q 15 minute rounds continue.

## 2015-06-08 NOTE — ED Notes (Signed)
Pt out to hallway requesting paper, yelling, will not stay in room. Pt stomping and shouting at this time. MD aware and witnessed behavior. Orders received.

## 2015-06-08 NOTE — ED Notes (Signed)
She has ambulated in and out of her room and up and down the hallway - She continues to ask for paper   Pt directed to remain in her room  Continue to monitor

## 2015-06-08 NOTE — ED Notes (Signed)
Report from Shannon, RN  

## 2015-06-08 NOTE — ED Notes (Signed)
She has had a visit from [employer] representative  - [place] has consulted -  Pt will be discharged back to her group home

## 2015-06-08 NOTE — BH Assessment (Addendum)
Writer received phone call from Group Home Nurse Lupita Leash(Donna [phone])-Stating she can not return due to being violent and aggressive  towards staff and other residents. She is stating the patient will need additional treatment.  Writer attempted to explain the patient was cleared by Psychiatrist and [facility] was in agreement for her to be go back to the Group Home. Group Home Nurse stated the patient was put under commitment and writer explain it was rescind the IVC.  Writer called Social Worker Gavin Pound(Deborah M.) and explained to her the situation. She will work on it. Writer also updated the patient's nurse (Amy T.) and ER MD (Dr. Alphonzo LemmingsMcShane).

## 2015-06-08 NOTE — ED Provider Notes (Signed)
Patient has been cleared by Dr. Toni Amendlapacs for discharge.  Monica FilbertJonathan E Branden Vine, MD 06/08/15 [phone]
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
## 2015-06-08 NOTE — ED Notes (Signed)
Pt is beginning to have more episodes of leaving room and asking staff for paper. Staff and pt both aware that pt is not getting more paper this evening. Pt has been givn ginger ale and crackers and cartoons have been turned on in room. Pt has calmed down at this time.

## 2015-06-08 NOTE — ED Notes (Signed)
Snack and beverage given. 

## 2015-06-08 NOTE — ED Notes (Signed)
Am meds administered as ordered  Assessment completed   She denies pain

## 2015-06-08 NOTE — ED Notes (Addendum)
2 Rn and officer at pt bedside, pt continues asking for paper then begins to yell and stomp in room. Pt not physically violent. Pt upset because paper was thrown away, pt was trying to eat paper.

## 2015-06-08 NOTE — ED Notes (Signed)
She just walked out of the BR - NAD observed

## 2015-06-08 NOTE — BH Assessment (Signed)
Patient was visited [facility] [phone](Megan [phone]) to complete crisis assessment. [facility] dicussed patient with Psych MD (Dr. Toni Amendlapacs) and will be discharged back to Group Home.   [facility] will re-open patient's case, for more crisis intervention services.   A copy of the Crisis Assessment placed on patient's paper chart.

## 2015-06-08 NOTE — ED Notes (Signed)
Patient observed lying in bed with eyes closed  Even, unlabored respirations observed   NAD pt appears to be sleeping  I will continue to monitor along with every 15 minute visual observations and ongoing security monitoring    

## 2015-06-08 NOTE — ED Notes (Signed)
ED BHU PLACEMENT JUSTIFICATION Is the patient under IVC or is there intent for IVC: Yes.   Is the patient medically cleared: Yes.   Is there vacancy in the ED BHU: Yes.   Is the population mix appropriate for patient: Yes.   Is the patient awaiting placement in inpatient or outpatient setting: Yes.   Has the patient had a psychiatric consult:  Consult pending Survey of unit performed for contraband, proper placement and condition of furniture, tampering with fixtures in bathroom, shower, and each patient room: Yes.  ; Findings:  APPEARANCE/BEHAVIOR Calm and cooperative NEURO ASSESSMENT Orientation: oriented x2 disoriented to time/situation   Denies pain Hallucinations: No.None noted (Hallucinations) Speech: Normal Gait: normal RESPIRATORY ASSESSMENT Even  Unlabored respirations  CARDIOVASCULAR ASSESSMENT Pulses equal   regular rate  Skin warm and dry   GASTROINTESTINAL ASSESSMENT no GI complaint EXTREMITIES Full ROM  PLAN OF CARE Provide calm/safe environment. Vital signs assessed twice daily. ED BHU Assessment once each 12-hour shift. Collaborate with intake RN daily or as condition indicates. Assure the ED provider has rounded once each shift. Provide and encourage hygiene. Provide redirection as needed. Assess for escalating behavior; address immediately and inform ED provider.  Assess family dynamic and appropriateness for visitation as needed: Yes.  ; If necessary, describe findings:  Educate the patient/family about BHU procedures/visitation: Yes.  ; If necessary, describe findings:

## 2015-06-08 NOTE — ED Notes (Signed)
Pt. To ED-BHU from ED ambulatory without difficulty, to room #6  . Report from RN. Pt. Is alert and oriented, warm and dry in no distress. Pt. Denies SI, HI, and AVH. Pt. Calm and cooperative. Pt. Made aware of security cameras and Q15 minute rounds. Pt. Encouraged to let Nursing staff know of any concerns or needs.  
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
## 2015-06-08 NOTE — ED Notes (Signed)
Lunch provided.

## 2015-06-08 NOTE — ED Notes (Signed)
BEHAVIORAL HEALTH ROUNDING Patient sleeping: Yes.   Patient alert and oriented: eyes closed  Appears asleep Behavior appropriate: Yes.  ; If no, describe:  Nutrition and fluids offered: Yes  Toileting and hygiene offered: sleeping Sitter present: q 15 minute observations and security monitoring Law enforcement present: yes  ODS 

## 2015-06-08 NOTE — ED Notes (Signed)
Pt. Noted in room resting quietly;. No complaints or concerns voiced. No distress or abnormal behavior noted. Will continue to monitor with security cameras. Q 15 minute rounds continue. 

## 2015-06-08 NOTE — ED Notes (Signed)
Report received from Michele M., RN. Pt. Alert and oriented in no distress denies SI, HI; , AVH and pain.  Pt. Instructed to come to me with problems or concerns.Will continue to monitor for safety via security cameras and Q 15 minute checks. 

## 2015-06-08 NOTE — ED Notes (Signed)
Took PM meds without difficulty. 

## 2015-06-08 NOTE — ED Notes (Signed)
Supper provided  

## 2015-06-08 NOTE — ED Notes (Addendum)
Pt given crackers and milk. Pt currently back and forth between bed and hallway. Pt asking about paper. Pt back to bed at this time.

## 2015-06-09 NOTE — ED Notes (Signed)
Pt. Noted in room sleeping;. No complaints or concerns voiced. No distress or abnormal behavior noted. Will continue to monitor with security cameras. Q 15 minute rounds continue. 

## 2015-06-09 NOTE — ED Notes (Signed)
Pt remains calm. Asked for paper again and RN again explained she could not have any paper. Pt accepting. No acute distress noted. Maintained on 15 minute checks and observation by security camera for safety.

## 2015-06-09 NOTE — ED Notes (Signed)
Pt awake and came to door still with no clothes on.   She told RN she was hungry.   Pt was calm and cooperative.   RN assisted patient to get her scrubs back on then she went to sitting area and watched TV while she ate lunch.

## 2015-06-09 NOTE — ED Notes (Signed)
RN cut scrub bottoms so they no longer drag on the floor. RN told  Pt if she is to come out of her room she must have on the scrub top and pants.  Pt is wearing pants currently. Pt continuously requesting snacks and paper. No paper has been given. Limits have been set with snacks. Maintained on 15 minute checks and observation by security camera for safety.

## 2015-06-09 NOTE — ED Notes (Addendum)
Patient resting quietly in room. No noted distress or abnormal behaviors noted. Will continue 15 minute checks and observation by security camera for safety.  Pt allowed RN to get VS. Remained calm.

## 2015-06-09 NOTE — ED Notes (Addendum)
Patient asleep in room. No noted distress or abnormal behavior. Will continue 15 minute checks and observation by security cameras for safety. 
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
## 2015-06-09 NOTE — BH Assessment (Signed)
Writer called [facility] ([phone]) and updated about the Group Home not getting the patient and the behaviors the patient had on last night (09:30), which resulted in her receiving IM medications. Writer dicussed with [facility] the possibility of [facility] Referral. [facility] states she will come to the ER, latter in the afternoon (06/09/2015) to complete another crisis Assessment for possible [facility] Referral.  Writer updated patient's nurse (Amy B.)

## 2015-06-09 NOTE — ED Notes (Signed)
[facility].   She is resting quietly on bed at this time.

## 2015-06-09 NOTE — ED Notes (Signed)
Patient resting quietly in room. No noted distress or abnormal behaviors noted. Will continue 15 minute checks and observation by security camera for safety. 

## 2015-06-09 NOTE — ED Notes (Signed)
PT IS BEYOND PSYCH INPT. CARE.  NEEDS LONG TERM MEDICAL HELP.CANNOT RETURN TO GROUP HOME.  CSW ASSISTING IN LOG TERM CARE PL;ACEMENT.  PT IS NOW VOL.

## 2015-06-09 NOTE — ED Notes (Signed)
Pt came to nursing station asking for paper. RN told pt we do not have paper we can give out on this unit. Pt accepting. Pt then asked for a snack and ginger ale. No distress noted. Maintained on 15 minute checks and observation by security camera for safety.

## 2015-06-09 NOTE — ED Provider Notes (Signed)
-----------------------------------------   6:55 AM on 06/09/2015 -----------------------------------------   Blood pressure 104/58, pulse 94, temperature 98.2 F (36.8 C), temperature source Oral, resp. rate 18, SpO2 98 %.  The patient had no acute events since last update.  Calm and cooperative at this time.  The patient has been discharged that she is awaiting transportation back to her home.   Loleta Roseory Nattie Lazenby, MD 06/09/15 [phone]

## 2015-06-09 NOTE — ED Notes (Signed)
Pt coming to nurses station more frequently asking for paper. Staff has told her we do not have any paper we can give her. Pt accepting and returned to her room. Pt remains calm.  Pt also asked for a sandwich immediately after dinner. RN informed patient she would have to wait a while for a snack. Pt returned to her room. No distress noted.  Maintained on 15 minute checks and observation by security camera for safety.

## 2015-06-09 NOTE — ED Notes (Signed)
Snack and beverage given. 

## 2015-06-09 NOTE — ED Notes (Signed)
Pt sleeping in room, resps even and unlabored, NAD noted.  Continue 15 min checks.

## 2015-06-09 NOTE — ED Notes (Signed)
Patient asleep in room. No noted distress or abnormal behavior. Will continue 15 minute checks and observation by security cameras for safety. 

## 2015-06-09 NOTE — Progress Notes (Signed)
CSW called [facility] ([phone]) [place] [facility] worked with patient several years however her case was placed in inactive status due to stability in her behavior.  Maggie confirmed that this behavior is new for patient.  [place] [facility] has now reactivated patient's case to assist with additional interventions for stabilization.   CSW explained that patient did not discharge back to her group home yesterday as the group home would not allow her to return without additional treatment.  While in the ED, patient displayed behavior the group home RN described.  [place] [facility] would like patient to go to one of their crisis beds before making a [facility].   Per [facility] GraterMaggie she anticipates a bed opening up next week and patient can stay up to 30 days if needed.   [place] [facility] has not communicated with patient's [facility] Care Coord[facility],  however they will reach out to them on Monday.  Monica [name] [phone]) is the [place] [facility] Coordinator, she will contact [employer], patient's guardian and the group home to coordinate patient's care.   Call to patient's guardian/grandmother Monica [name] [phone])  States she has spoken to [facility] Friday, informed her they will call her on Monday to set up a meeting.  Monica [name] also states she has spoken with patient's [facility] Care Coordinator Aram BeechamCynthia [phone].  States Aram BeechamCynthia is going to look for a new group home for patient.  CSW discussed concerns of moving patient when she has been stable for two years at current group home.  Current group home is willing to take patient back if she is stable.  Informed Monica [name] [place] [facility] will contact her Monday to discuss the crisis bed for patient.   Guardian Monica [name] is in agreement with trying to keep patient at current group home if possible.  CSW will follow up with [place] [facility] Coordinator  Marchelle Folksmanda on Monday to assist with disposition.   Sammuel Hineseborah Charlina Dwight. LCSWA Clinical Social Work Department [phone] 10:35 AM

## 2015-06-09 NOTE — ED Notes (Signed)
Pt's mother called to check on patient and ask about visiting hours. Pt's mother stated she may come visit tomorrow.

## 2015-06-09 NOTE — ED Notes (Signed)
Report received from Amy B., RN. Pt. Alert and oriented in no distress denies SI, HI, AVH and pain.  Pt. Instructed to come to me with problems or concerns.Will continue to monitor for safety via security cameras and Q 15 minute checks. 

## 2015-06-10 NOTE — ED Notes (Signed)
Pt. Noted in room resting quietly;. No complaints or concerns voiced. No distress or abnormal behavior noted. Will continue to monitor with security cameras. Q 15 minute rounds continue. 

## 2015-06-10 NOTE — ED Notes (Signed)
Pt requested toilet tissue. RN offered a small amount of tissue. Pt refused to take it and wanted an entire roll. RN told pt that was not possible. No distress noted. Maintained on 15 minute checks and observation by security camera for safety.

## 2015-06-10 NOTE — ED Notes (Signed)
Pt sleeping in room. Pt has been calm. No distress noted. Maintained on 15 minute checks and observation by security camera for safety.

## 2015-06-10 NOTE — ED Notes (Signed)
Patient asleep in room. No noted distress or abnormal behavior. Will continue 15 minute checks and observation by security cameras for safety. 

## 2015-06-10 NOTE — ED Notes (Signed)
Pt had a visit from her mother. Pt remained calm and appeared to enjoy the visit. Visit was monitored by patient relations. No distress noted. Maintained on 15 minute checks and observation by security camera for safety.

## 2015-06-10 NOTE — ED Notes (Signed)
Pt. Noted in room. No complaints or concerns voiced. No distress or abnormal behavior noted. Will continue to monitor with security cameras. Q 15 minute rounds continue. 

## 2015-06-10 NOTE — ED Notes (Signed)
Pt. Noted in the hall;. No complaints or concerns voiced. No distress or abnormal behavior noted. Will continue to monitor with security cameras. Q 15 minute rounds continue.

## 2015-06-10 NOTE — ED Notes (Signed)
Pt eating breakfast and was compliant with 8 am medication. No distress noted. Maintained on 15 minute checks and observation by security camera for safety.

## 2015-06-10 NOTE — ED Provider Notes (Signed)
-----------------------------------------   8:43 AM on 06/10/2015 -----------------------------------------   Blood pressure 135/87, pulse 116, temperature 97.4 F (36.3 C), temperature source Oral, resp. rate 18, SpO2 100 %.  The patient had no acute events since last update.  Calm and cooperative at this time.  Disposition is pending per Psychiatry/Behavioral Medicine team recommendations.   I will discuss with the patient's nurse her tachycardia which she had at noon and then again at 8 PM. The patient is currently waiting for placement.  Rebecka ApleyAllison P Webster, MD 06/10/15 [phone]

## 2015-06-10 NOTE — ED Notes (Signed)
Pt needed strong encouragement to allow VS to be taken.  Resting in room. No distress noted. Maintained on 15 minute checks and observation by security camera for safety.

## 2015-06-10 NOTE — ED Notes (Signed)
Pt. Noted in room sleeping;. No complaints or concerns voiced. No distress or abnormal behavior noted. Will continue to monitor with security cameras. Q 15 minute rounds continue. 

## 2015-06-10 NOTE — ED Notes (Signed)
Patient resting quietly in room. No noted distress or abnormal behaviors noted. Will continue 15 minute checks and observation by security camera for safety. 

## 2015-06-10 NOTE — ED Notes (Signed)
Pt. Woke up at 5:54am and asked for a snack/ Pt. Was given something to drink and then went back to bed. Pt. Very cooperative and calm

## 2015-06-10 NOTE — ED Notes (Signed)
Pt starting to scream, cry and bang on door.  Pt offered a doll, but refused it. Pt wanting paper. (Paper not provided).  Maintained on 15 minute checks and observation by security camera for safety.

## 2015-06-10 NOTE — ED Notes (Signed)
Pt has calmed down and has put her scrubs back on. Pt resting in bed. No distress noted. Maintained on 15 minute checks and observation by security camera for safety.

## 2015-06-10 NOTE — ED Notes (Signed)
Pt refused her lunch. RN holding tray in case pt wants to eat later. Pt went back to sleep after taking medications. No distress noted. Maintained on 15 minute checks and observation by security camera for safety.

## 2015-06-10 NOTE — ED Notes (Signed)
Pt. Noted in room asleep;. No complaints or concerns voiced. No distress or abnormal behavior noted. Will continue to monitor with security cameras. Q 15 minute rounds continue. 

## 2015-06-10 NOTE — ED Notes (Signed)
Pt. Noted in  room watching the tv.;. No complaints or concerns voiced. No distress or abnormal behavior noted. Will continue to monitor with security cameras. Q 15 minute rounds continue. 

## 2015-06-11 LAB — VALPROIC ACID LEVEL: VALPROIC ACID LVL: 76 ug/mL (ref 50.0–100.0)

## 2015-06-11 NOTE — ED Notes (Signed)
Several staff picked her up from group home

## 2015-06-11 NOTE — Progress Notes (Signed)
LCSW received a call from Darly[facility] Russian[facility] Carr [facility] and patient will be picked up by Group Home manager EDP notified.  Delta Air LinesClaudine Dru Laurel LCSW [phone]

## 2015-06-11 NOTE — ED Provider Notes (Signed)
-----------------------------------------   6:33 AM on 06/11/2015 -----------------------------------------   Blood pressure 128/87, pulse 109, temperature 98.9 F (37.2 C), temperature source Oral, resp. rate 18, SpO2 100 %.  The patient had no acute events since last update.  Calm and cooperative at this time.  Disposition is pending per Psychiatry/Behavioral Medicine team recommendations.     Irean HongJade J Sung, MD 06/11/15 [phone]
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
## 2015-06-11 NOTE — ED Notes (Signed)
Ate well for breakfast.

## 2015-06-11 NOTE — ED Notes (Signed)
Pt is awake and active this evening. Pt mood is anxious and she requires frequent redirection. Pt is preoccupied with paper and made numerous requests, but none given due to her habit of swallowing it. Pt behavior is childish and attention seeking, but she is redirectable. Pt is cooperative with staff and taking medications as prescribed. Food and drink provided and 15 minute checks are ongoing for safety.

## 2015-06-11 NOTE — ED Notes (Signed)
Pt awake no c/o at this time , given breakfast

## 2015-06-11 NOTE — Discharge Instructions (Signed)
Intermittent Explosive Disorder Intermittent explosive disorder is a mental illness. With this disorder, you have trouble controlling angry impulses. You repeatedly have sudden episodes of aggression (explosive rages). The rages are unplanned and out of proportion to the situation. They can result in serious injury to people and animals or damage to property. Intermittent explosive disorder may interfere with personal relationships, school, or work. It may cause legal or financial problems. Some people with intermittent explosive disorder feel guilty or embarrassed after a rage. Intermittent explosive disorder is not diagnosed before age 3 years and most commonly starts in the teenage years. The disorder may go away after 10 or 20 years or may last for life. It is more common in men than women. This disorder is also more common in people with a history of physical abuse or with family members who have the disorder. With intermittent explosive disorder, you may often have one or more other mental disorders, including other impulse control disorders, depression, anxiety, and substance abuse. You are also at risk for self-injury and suicide attempts. SIGNS AND SYMPTOMS  Explosive rages may be verbal, physical, or both. Examples of verbal rages may include:  Temper tantrums after the age of 6 years.  Verbal arguments or fights.  Vicious name calling, cursing, or insulting others.  Road rage. Physical rages are directed at people, animals, or property. They may or may not cause injury or damage. Examples of physical aggression may include:  Pushing, slapping, punching, choking, or kicking.  Punching holes in walls, breaking furniture, or throwing items. Verbal rages and nondamaging physical rages occur twice a week on average for 3 months or longer. Rages causing physical injury or property damage occur at least three times within a 5337-month period. Rages usually last less than 30 minutes. In between  rages, you may have no signs of anger, or you may be irritable. Before or during an episode you may have one of the following sensations:  Tingling.  Tremors.  Pounding heart.  Chest tightness.  Head pressure.  Hearing an echo. DIAGNOSIS  Intermittent explosive disorder is diagnosed through an assessment by your health care provider. He or she will ask questions about your rages and how they affect your life. Your health care provider may also ask about:  Your mood.  Your thoughts.  Your other behaviors.  Your medical history.  Your use of medicines or drugs. Your health care provider may do a physical exam and order lab tests or brain imaging studies. Similar symptoms may occur with a number of other mental illnesses, certain brain disorders, and the use of certain substances. Your health care provider may refer you to a mental health professional for evaluation. TREATMENT  Treatment for intermittent explosive disorder is provided by mental health specialists. It includes the following options:  Counseling or talk therapy. One form of talk therapy focuses on your underlying feelings and reasons that lead to rages. Another form of talk therapy looks at your triggers for rages and ways to prevent or control them.  Support groups. These provide emotional support and advice from other people with anger management problems.  Medicines. Several different types of medicine may help reduce the frequency and severity of explosive rages. These include certain antidepressants, mood stabilizers, anticonvulsants, major tranquilizers (antipsychotics), and beta blockers. The most effective treatment is usually a combination of counseling, group support, and medicine. HOME CARE INSTRUCTIONS   When possible, leave or avoid situations that upset you.  Keep all follow-up visits as directed by  your health care provider.  Take medicines only as directed by your health care provider. SEEK  MEDICAL CARE IF:   Your symptoms get worse.  You are not able to take your medicine as told. SEEK IMMEDIATE MEDICAL CARE IF:   You have serious thoughts about hurting yourself or others.  You have seriously injured yourself or someone else.   This information is not intended to replace advice given to you by your health care provider. Make sure you discuss any questions you have with your health care provider.   Document Released: 07/01/2007 Document Revised: 04/14/2014 Document Reviewed: 05/20/2013 Elsevier Interactive Patient Education [employer]2016 Elsevier Inc.   Return to the emergency department for thoughts of hurting herself or anyone else, hallucinations, or any other symptoms concerning to you.

## 2015-06-11 NOTE — ED Notes (Addendum)
Pt. Noted in room. No complaints or concerns voiced. No distress or abnormal behavior noted. Will continue to monitor with security cameras. Q 15 minute rounds continue. 

## 2015-06-11 NOTE — ED Notes (Addendum)
Pt was very cooperative this morning ,ate well and took her meds without problem . Got dressed with no problem and was dc with 3 staff ., no aggression noted at all

## 2015-11-28 IMAGING — CT CT HEAD W/O CM
1 series · 16 of 30 positions shown, 20 images · non-contrast
Comparison: None.

CLINICAL DATA: Fall this morning at 7 a.m., striking forehead on
the wall, headache since that time. Mental disability.

EXAM:
CT HEAD WITHOUT CONTRAST
TECHNIQUE: Contiguous axial images were obtained from the base of the skull
through the vertex without intravenous contrast.

[Series 2: head wo · axial · 0.39mm/px · z∈[+525,+683]mm · 16 of 36 slices shown, 20 images]
[im 2/36  brain]
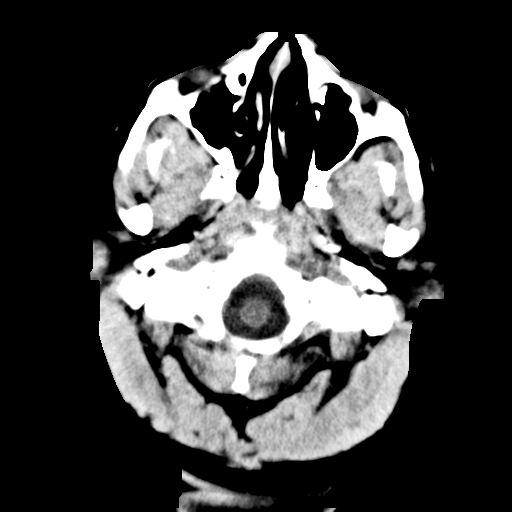
[im 2/36  bone]
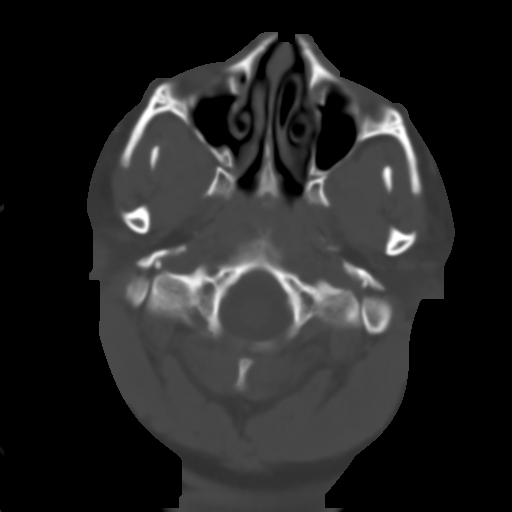
[im 4/36  brain]
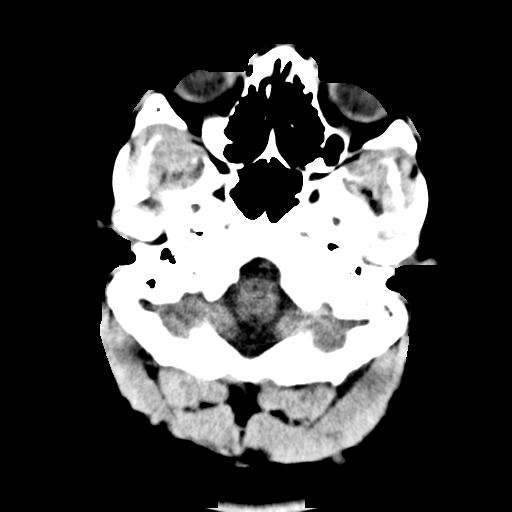
[im 7/36  brain]
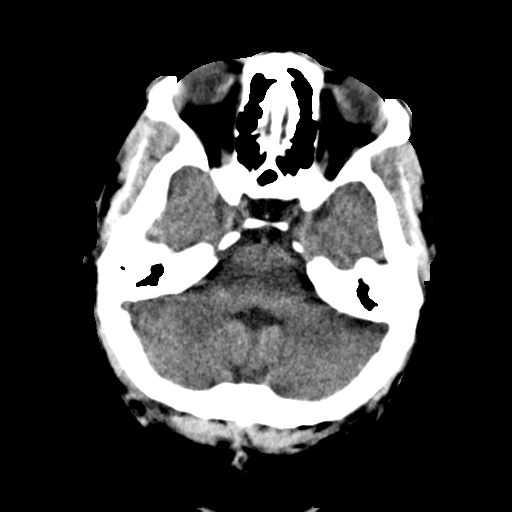
[im 9/36  brain]
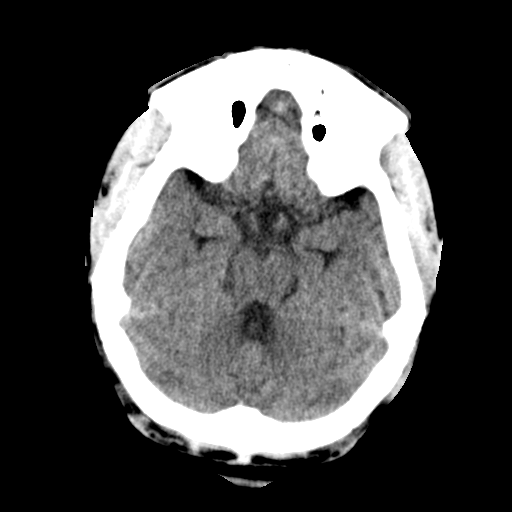
[im 10/36  brain]
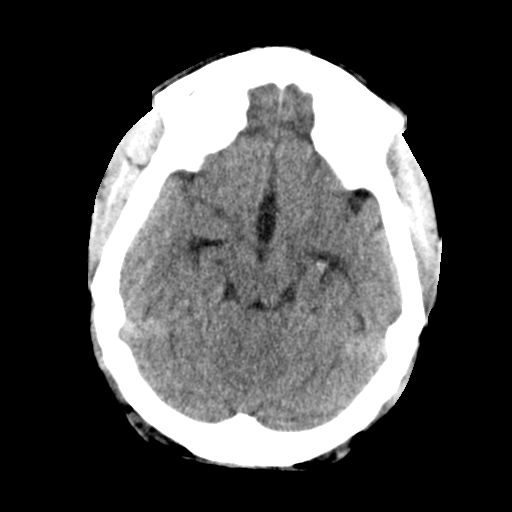
[im 10/36  bone]
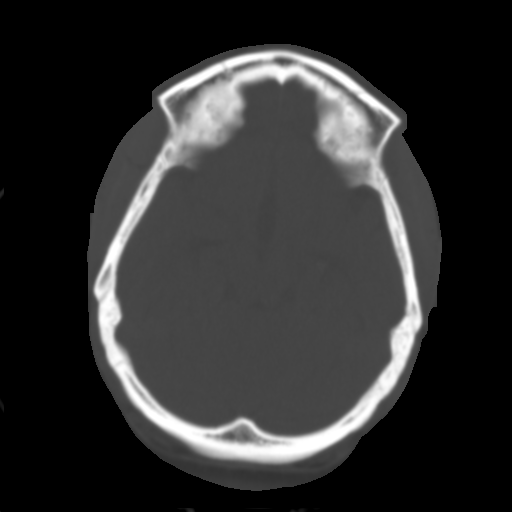
[im 13/36  brain]
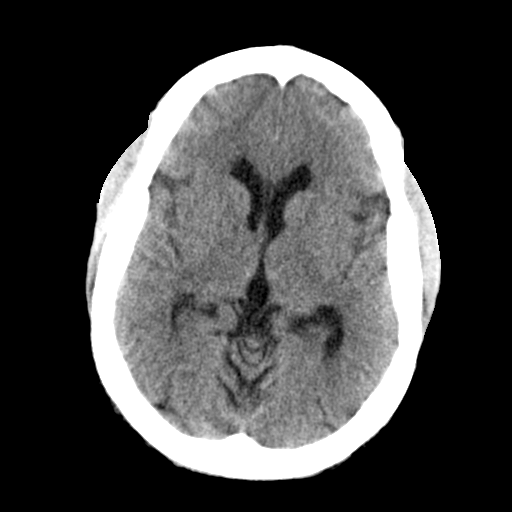
[im 15/36  brain]
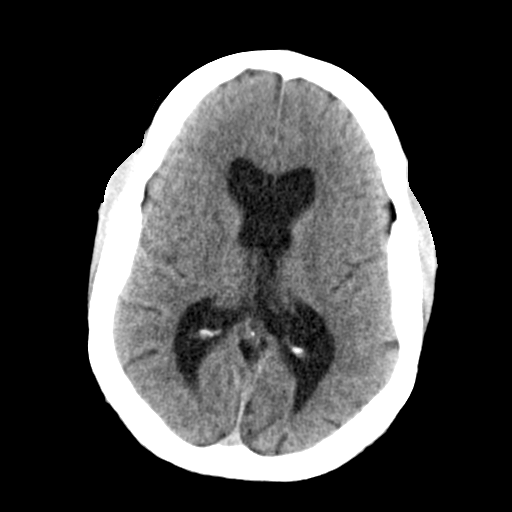
[im 17/36  brain]
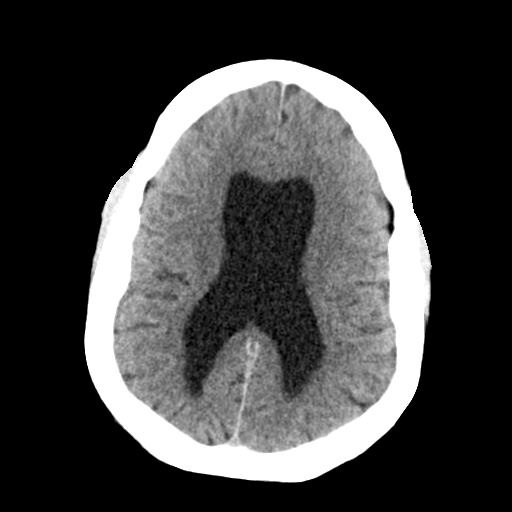
[im 19/36  brain]
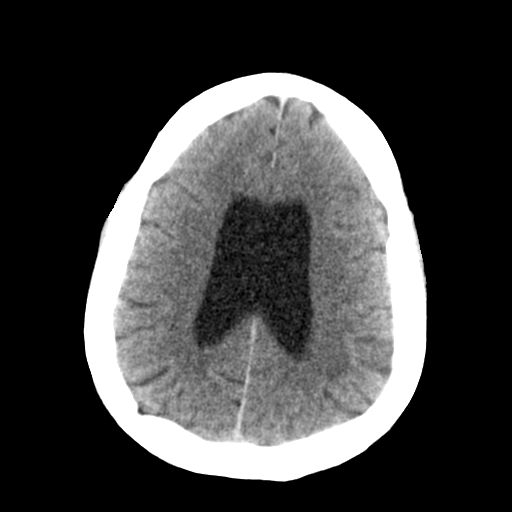
[im 19/36  bone]
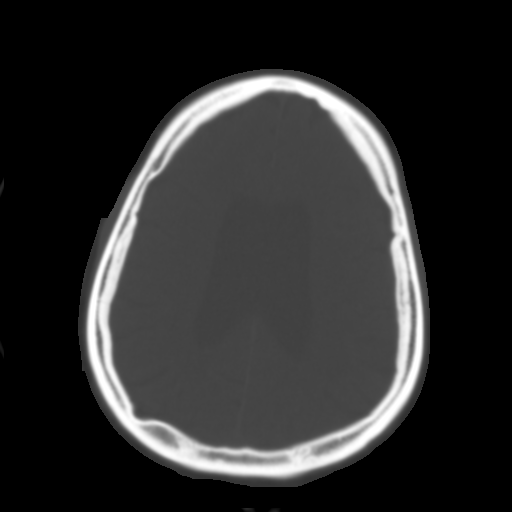
[im 21/36  brain]
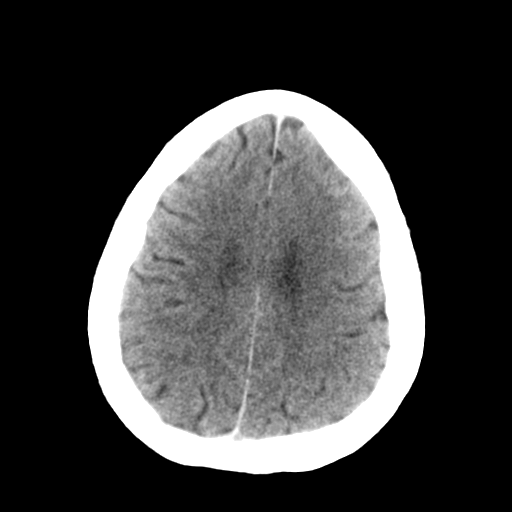
[im 23/36  brain]
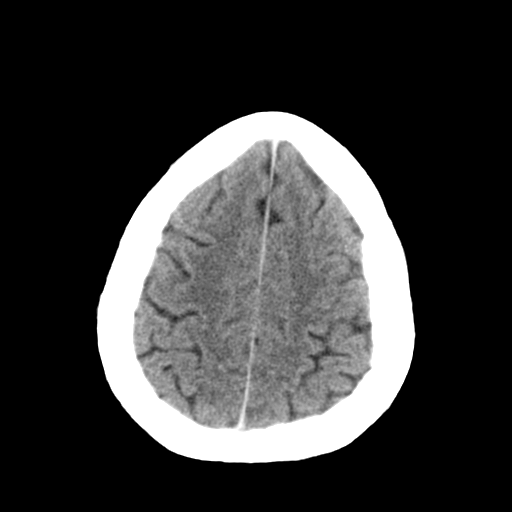
[im 26/36  brain]
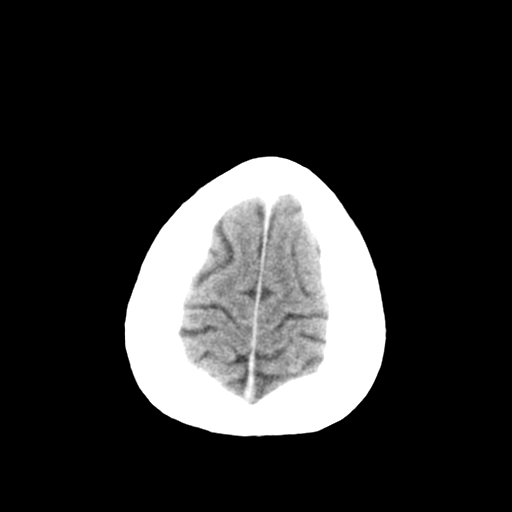
[im 27/36  brain]
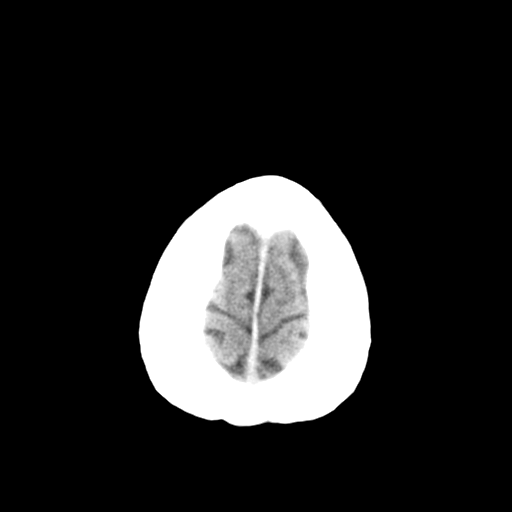
[im 27/36  bone]
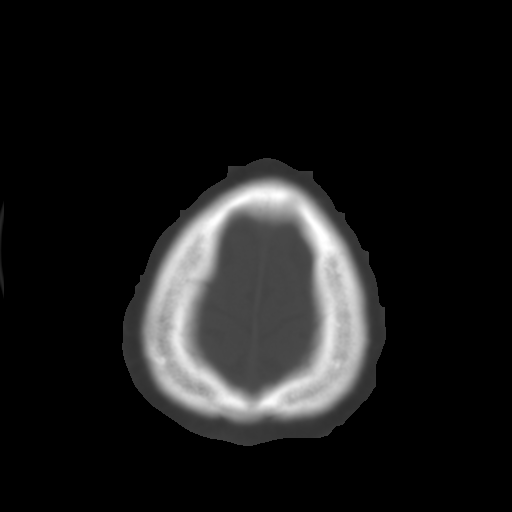
[im 29/36  brain]
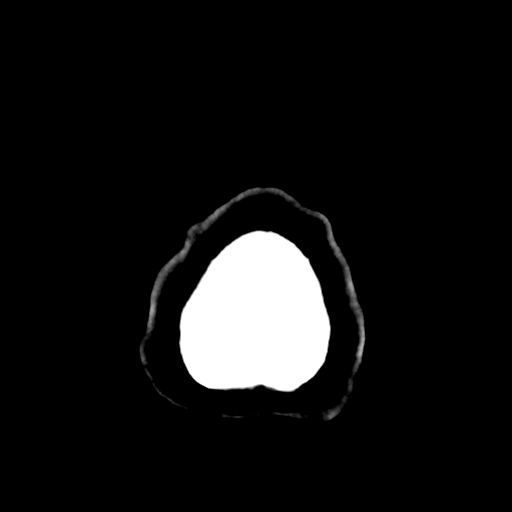
[im 32/36  brain]
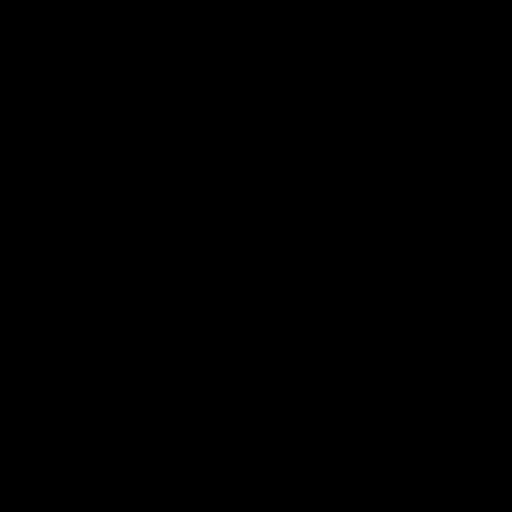
[im 34/36  brain]
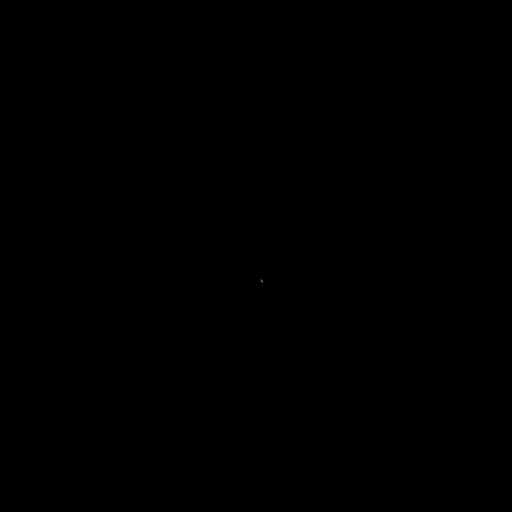

[16 of 30 positions shown; findings below may reference images not displayed]

FINDINGS: The brainstem, cerebellum, cerebral peduncles, thalami, and basal
ganglia appear unremarkable. The corpus callosum may be mildly thin
but I do not believe that it is absent. Slight ventricular
prominence. No hydrocephalus.

No intracranial hemorrhage, mass lesion, or acute CVA.
IMPRESSION: 1. No acute intracranial findings.
2. There may be some mild thinning/hypoplasia of the corpus
callosum.

## 2016-08-12 ENCOUNTER — Emergency Department
Admission: EM | Admit: 2016-08-12 | Discharge: 2016-08-15 | Disposition: A | Discharge status: Home / Self Care | Payer: Medicaid Other | Attending: Emergency Medicine | Admitting: Emergency Medicine

## 2016-08-12 DIAGNOSIS — Z79899 Other long term (current) drug therapy: Secondary | ICD-10-CM | POA: Diagnosis not present

## 2016-08-12 DIAGNOSIS — F250 Schizoaffective disorder, bipolar type: Secondary | ICD-10-CM | POA: Diagnosis not present

## 2016-08-12 DIAGNOSIS — R451 Restlessness and agitation: Secondary | ICD-10-CM | POA: Diagnosis present

## 2016-08-12 DIAGNOSIS — F79 Unspecified intellectual disabilities: Secondary | ICD-10-CM

## 2016-08-12 DIAGNOSIS — F259 Schizoaffective disorder, unspecified: Secondary | ICD-10-CM

## 2016-08-12 DIAGNOSIS — R4689 Other symptoms and signs involving appearance and behavior: Secondary | ICD-10-CM | POA: Diagnosis present

## 2016-08-12 LAB — COMPREHENSIVE METABOLIC PANEL
ALK PHOS: 78 U/L (ref 38–126)
ALT: 15 U/L (ref 14–54)
AST: 23 U/L (ref 15–41)
Albumin: 4.4 g/dL (ref 3.5–5.0)
Anion gap: 9 (ref 5–15)
BILIRUBIN TOTAL: 0.5 mg/dL (ref 0.3–1.2)
BUN: 13 mg/dL (ref 6–20)
CO2: 25 mmol/L (ref 22–32)
CREATININE: 0.57 mg/dL (ref 0.44–1.00)
Calcium: 9.5 mg/dL (ref 8.9–10.3)
Chloride: 102 mmol/L (ref 101–111)
GFR calc Af Amer: >60 mL/min (ref >60)
Glucose, Bld: 167 mg/dL — ABNORMAL HIGH (ref 65–99)
POTASSIUM: 3.9 mmol/L (ref 3.5–5.1)
Sodium: 136 mmol/L (ref 135–145)
Total Protein: 8.5 g/dL — ABNORMAL HIGH (ref 6.5–8.1)

## 2016-08-12 LAB — CBC
HCT: 37.3 % (ref 35.0–47.0)
Hemoglobin: 12.8 g/dL (ref 12.0–16.0)
MCH: 30.9 pg (ref 26.0–34.0)
MCHC: 34.4 g/dL (ref 32.0–36.0)
MCV: 89.9 fL (ref 80.0–100.0)
Platelets: 186 10*3/uL (ref 150–440)
RBC: 4.15 MIL/uL (ref 3.80–5.20)
RDW: 14.4 % (ref 11.5–14.5)
WBC: 3.5 10*3/uL — ABNORMAL LOW (ref 3.6–11.0)

## 2016-08-12 LAB — ETHANOL: Alcohol, Ethyl (B): <5 mg/dL (ref <5)

## 2016-08-12 LAB — SALICYLATE LEVEL

## 2016-08-12 LAB — ACETAMINOPHEN LEVEL

## 2016-08-12 NOTE — ED Notes (Signed)
Pt brought by caretakers stating she is a danger to self and others, pt not compliant in triage grabbing things around the room and refusing to replace.

## 2016-08-12 NOTE — BH Assessment (Signed)
Group Home Staff: Ysidro EvertWilliam Carr: [phone]: [phone] Annetta: [phone]: [phone]

## 2016-08-12 NOTE — ED Triage Notes (Signed)
She arrives to the ED with her caregivers from the group home  - caregivers report that she has been aggressive with other residents and has been threatening staff

## 2016-08-12 NOTE — ED Notes (Signed)
Patient assigned to appropriate care area. Patient oriented to unit/care area: Informed that, for their safety, care areas are designed for safety and monitored by security cameras at all times; and visiting hours explained to patient. Patient verbalizes understanding. 

## 2016-08-12 NOTE — ED Provider Notes (Signed)
[facility] Emergency Department Provider Note  ____________________________________________   First MD Initiated Contact with Patient 08/12/16 1938     (approximate)  I have reviewed the triage vital signs and the nursing notes.   HISTORY  Chief Complaint Paranoid and Manic Behavior  History is limited by the patient's developmental delay  HPI Monica [name] is a 26 y.o. female who sent to the emergency department from her group home for increasingly agitated behavior. History is difficult to obtain as the patient has significant developmental delay and isn't really sure why she is here. According to reports the patient has been acting out and been more aggressive in the group home and they've no longer want her to stay there.   Past Medical History:  Diagnosis Date  . Mentally disabled   . Schizo affective schizophrenia (HCC)   . Seizures [facility](HCC)     Patient Active Problem List   Diagnosis Date Noted  . Schizo affective schizophrenia (HCC) 08/13/2016  . Aggression 05/03/2015  . Intellectual disability 05/03/2015    No past surgical history on file.  Prior to Admission medications   Medication Sig Start Date End Date Taking? Authorizing Provider  cloNIDine (CATAPRES) 0.1 MG tablet Take 0.1 mg by mouth 3 (three) times daily.   Yes [provider]  desonide (DESOWEN) 0.05 % cream Apply 1 application topically 2 (two) times daily.   Yes [provider]  docusate sodium (COLACE) 100 MG capsule Take 100 mg by mouth daily.   Yes [provider]  gabapentin (NEURONTIN) 300 MG capsule Take 1,800 mg by mouth at bedtime.    Yes [provider]  medroxyPROGESTERone (DEPO-PROVERA) 150 MG/ML injection Inject 150 mg into the muscle every 3 (three) months.   Yes [provider]  prazosin (MINIPRESS) 2 MG capsule Take 1 mg by mouth at bedtime.   Yes [provider]  cloNIDine (CATAPRES) 0.1 MG tablet Take 1  tablet (0.1 mg total) by mouth 2 (two) times daily. 08/15/16 08/15/17  Emily FilbertWilliams, Jonathan E, MD  cloNIDine (CATAPRES) 0.1 MG tablet Take 1 tablet (0.1 mg total) by mouth 3 (three) times daily. 08/15/16 08/15/17  Emily FilbertWilliams, Jonathan E, MD  divalproex (DEPAKOTE) 500 MG DR tablet Take 1 tablet (500 mg total) by mouth 3 (three) times daily. 08/15/16   Emily FilbertWilliams, Jonathan E, MD  ibuprofen (ADVIL,MOTRIN) 600 MG tablet Take 1 tablet (600 mg total) by mouth every 6 (six) hours as needed. Patient not taking: Reported on 08/13/2016 01/17/15   Evangeline DakinBeers, Charles M, PA-C  QUEtiapine (SEROQUEL XR) 400 MG 24 hr tablet Take 2 tablets (800 mg total) by mouth at bedtime. 08/15/16   Emily FilbertWilliams, Jonathan E, MD  senna (SENOKOT) 8.6 MG tablet Take 2 tablets by mouth at bedtime as needed for constipation.    [provider]    Allergies Patient has no known allergies.  No family history on file.  Social History Social History  Substance Use Topics  . Smoking status: Never Smoker  . Smokeless tobacco: Never Used  . Alcohol use No    Review of Systems Level V exemption history Limited by the patient's developmental delay 10-point ROS otherwise negative.  ____________________________________________   PHYSICAL EXAM:  VITAL SIGNS: ED Triage Vitals [08/12/16 1906]  Enc Vitals Group     BP (!) 140/100     Pulse Rate (!) 119     Resp 20     Temp 98.8 F (37.1 C)     Temp Source  Oral     SpO2 98 %     Weight      Height      Head Circumference      Peak Flow      Pain Score      Pain Loc      Pain Edu?      Excl. in GC?     Constitutional: Chronically ill-appearing but in no acute distress Eyes: PERRL EOMI. Head: Atraumatic. Nose: No congestion/rhinnorhea. Mouth/Throat: No trismus Neck: No stridor.   Cardiovascular: Normal rate, regular rhythm. Grossly normal heart sounds.  Good peripheral circulation. Respiratory: Normal respiratory effort.  No retractions. Lungs CTAB and moving good  air Gastrointestinal: Soft nondistended and nontender Musculoskeletal: No lower extremity edema   Neurologic:   No gross focal neurologic deficits are appreciated. Skin:  Skin is warm, dry and intact. No rash noted.     ____________________________________________    ____________________________________________   LABS (all labs ordered are listed, but only abnormal results are displayed)  Labs Reviewed  COMPREHENSIVE METABOLIC PANEL - Abnormal; Notable for the following:       Result Value   Glucose, Bld 167 (*)    Total Protein 8.5 (*)    All other components within normal limits  ACETAMINOPHEN LEVEL - Abnormal; Notable for the following:    Acetaminophen (Tylenol), Serum <10 (*)    All other components within normal limits  CBC - Abnormal; Notable for the following:    WBC 3.5 (*)    All other components within normal limits  URINE DRUG SCREEN, QUALITATIVE ([facility] ONLY) - Abnormal; Notable for the following:    Tricyclic, Ur Screen POSITIVE (*)    All other components within normal limits  URINALYSIS, COMPLETE (UACMP) WITH MICROSCOPIC - Abnormal; Notable for the following:    Color, Urine YELLOW (*)    APPearance HAZY (*)    Leukocytes, UA TRACE (*)    Squamous Epithelial / LPF 0-5 (*)    All other components within normal limits  ETHANOL  SALICYLATE LEVEL    Labs unremarkable __________________________________________  EKG   ____________________________________________  RADIOLOGY   ____________________________________________   PROCEDURES  Procedure(s) performed: no  Procedures  Critical Care performed: no  Observation: no ____________________________________________   INITIAL IMPRESSION / ASSESSMENT AND PLAN / ED COURSE  Pertinent labs & imaging results that were available during my care of the patient were reviewed by me and considered in my medical decision making (see chart for details).  The patient arrives with no acute medical  complaints and she agrees to stay to be evaluated. She'll require evaluation by psychiatry as she has no acute medical issues at this time. I will also consult social work.  Clinical Course as of Aug 15 2245  Fri Aug 15, 2016  1503 Patient has been cleared for discharge  [JW]    Clinical Course User Index [JW] Emily Filbert, MD     ____________________________________________   FINAL CLINICAL IMPRESSION(S) / ED DIAGNOSES  Final diagnoses:  Agitation      NEW MEDICATIONS STARTED DURING THIS VISIT:  Discharge Medication List as of 08/15/2016  3:08 PM       Note:  This document was prepared using Dragon voice recognition software and may include unintentional dictation errors.     Merrily Brittle, MD 08/15/16 2248

## 2016-08-12 NOTE — BH Assessment (Addendum)
Assessment Note  Monica [name] is an 25 y.o. female.   TTS unable to assess patient due to low functioning/IDD.   This Clinical research associate spoke with Annice Pih (Pt's Caregiver/Group Home Staff) who reports witnessing pt "hollering, screaming, beating herself, throwing herself on the ground. She threw a tv and a radio. She slams doors around the house. She will run down the street with no clothes on. She has scratched herself until she bleeds and she jumped on a staff member before". Staff member reports pt exhibits the above behaviors at least 4-5x a week and pt is difficult to redirect.  Diagnosis: Schizoaffective Disorder   Past Medical History:  Past Medical History:  Diagnosis Date  . Mentally disabled   . Schizo affective schizophrenia (HCC)   . Seizures (HCC)     No past surgical history on file.  Family History: No family history on file.  Social History:  reports that she has never smoked. She has never used smokeless tobacco. She reports that she does not drink alcohol or use drugs.  Additional Social History:  Alcohol / Drug Use Pain Medications: Unable to Assess Prescriptions: Unable to Assess Over the Counter: Unable to Assess  CIWA: CIWA-Ar BP: (!) 142/99 Pulse Rate: 98 COWS:    Allergies: No Known Allergies  Home Medications:  (Not in a hospital admission)  OB/GYN Status:  No LMP recorded. Patient has had an injection.  General Assessment Data Location of Assessment: [facility] ED TTS Assessment: In system Is this a Tele or Face-to-Face Assessment?: Face-to-Face Is this an Initial Assessment or a Re-assessment for this encounter?: Initial Assessment Marital status: Single Maiden name: N/A Is patient pregnant?: No Pregnancy Status: No Living Arrangements: Group [facility]) Can pt return to current living arrangement?:  [facility]) Admission Status: Voluntary Is patient capable of signing voluntary admission?: No Referral Source: Other (Group Home Staff ) Insurance  type: Medicaid  Medical Screening Exam [facility] Walk-in ONLY) Medical Exam completed: Yes  Crisis Care Plan Living Arrangements: Group Home ([facility]) Legal Guardian: Other: Database administrator) Name of Psychiatrist: Dr. Ave Filter Name of Therapist: None  Education Status Is patient currently in school?: No Current Grade: N/A Highest grade of school patient has completed: [employer] - Unable to Assess Name of school: [employer] - Unable to Assess Contact person: [facility]s - Unable to Assess  Risk to self with the past 6 months Suicidal Ideation:  [facility] Bellows - Unable to Assess) Has patient been a risk to self within the past 6 months prior to admission? :  Otho Bellows - Unable to Assess) Suicidal Intent:  Otho Bellows - Unable to Assess) Has patient had any suicidal intent within the past 6 months prior to admission? :  Otho Bellows - Unable to Assess) Is patient at risk for suicide?:  Otho Bellows - Unable to Assess) Suicidal Plan?:  O[facility] Bellows - Unable to Assess) Has patient had any suicidal plan within the past 6 months prior to admission? :  Otho Bellows - Unable to Assess) Access to Means:  [facility] - Unable to Assess) What has been your use of drugs/alcohol within the last 12 months?:  ([employer] - Unable to Assess) Previous Attempts/Gestures:  Otho Bellows - Unable to Assess) How many times?:  Otho Bellows - Unable to Assess) Other Self Harm Risks:  (UK[employer] - Unable to Assess) Triggers for Past Attempts:  Otho Bellows - Unable to Assess) Intentional Self Injurious Behavior:  (UKN - Unable to Assess) Family Suicide History: Unable to assess Recent stressful life event(s):  Otho Bellows - Unable to Assess) Persecutory voices/beliefs?:  (  Otho Bellows - Unable to Assess) Depression:  Otho Bellows - Unable to Assess) Depression Symptoms:  Otho Bellows - Unable to Assess) Substance abuse history and/or treatment for substance abuse?:  (UKN - Unable to Assess) Suicide prevention information given to non-admitted patients:  (UK[employer] - Unable to Assess)  Risk to Others within the past 6 months Homicidal Ideation:  Otho Bellows - Unable  to Assess) Does patient have any lifetime risk of violence toward others beyond the six months prior to admission? :  Otho Bellows - Unable to Assess) Thoughts of Harm to Others:  Otho Bellows - Unable to Assess) Current Homicidal Intent:  Otho Bellows - Unable to Assess) Current Homicidal Plan:  Otho Bellows - Unable to Assess) Access to Homicidal Means:  O[facility] Bellows - Unable to Assess) Identified Victim:  Otho Bellows - Unable to Assess) History of harm to others?:  Otho Bellows - Unable to Assess) Assessment of Violence:  Otho Bellows - Unable to Assess) Violent Behavior Description:  Otho Bellows - Unable to Assess) Does patient have access to weapons?:  Otho Bellows - Unable to Assess) Criminal Charges Pending?:  Otho Bellows - Unable to Assess) Does patient have a court date:  Otho Bellows - Unable to Assess) Is patient on probation?:  Otho Bellows - Unable to Assess)  Psychosis Hallucinations:  Otho Bellows - Unable to Assess) Delusions:  Otho Bellows - Unable to Assess)  Mental Status Report Appearance/Hygiene: In scrubs Eye Contact: Unable to Assess Motor Activity: Unable to assess Speech: Unable to assess Level of Consciousness: Unable to assess Mood:  (UKN - Unable to Assess) Affect: Unable to Assess Anxiety Level:  Otho Bellows - Unable to Assess) Thought Processes: Unable to Assess Judgement: Unable to Assess Orientation:  Otho Bellows - Unable to Assess) Obsessive Compulsive Thoughts/Behaviors: Unable to Assess  Cognitive Functioning Concentration: Unable to Assess Memory: Unable to Assess IQ: Below Average Level of Function: Low Insight: Unable to Assess Impulse Control: Unable to Assess Appetite:  (UKN - Unable to Assess) Weight Loss:  (UKN - Unable to Assess) Weight Gain:  (UKN - Unable to Assess) Sleep: Unable to Assess Total Hours of Sleep:  (UKN - Unable to Assess) Vegetative Symptoms: Unable to Assess  ADLScreening [facility] Assessment Services) Patient's cognitive ability adequate to safely complete daily activities?:  Otho Bellows - Unable to Assess) Patient able to express need for  assistance with ADLs?:  (UKN - Unable to Assess) Independently performs ADLs?:  Otho Bellows - Unable to Assess)  Prior Inpatient Therapy Prior Inpatient Therapy:  Otho Bellows - Unable to Assess) Prior Therapy Dates:  Otho Bellows - Unable to Assess) Prior Therapy Facilty/Provider(s):  [facility]s - Unable to Assess) Reason for Treatment:  Otho Bellows - Unable to Assess)  Prior Outpatient Therapy Prior Outpatient Therapy:  Otho Bellows - Unable to Assess) Prior Therapy Dates:  Otho Bellows - Unable to Assess) Prior Therapy Facilty/Provider(s):  Otho Bellows - Unable to Assess) Reason for Treatment:  Otho Bellows - Unable to Assess) Does patient have an ACCT team?:  Otho Bellows - Unable to Assess) Does patient have Intensive In-House Services?  :  Otho Bellows - Unable to Assess) Does patient have [facility] services? :  Otho Bellows - Unable to Assess) Does patient have P4CC services?:  Otho Bellows - Unable to Assess)  ADL Screening (condition at time of admission) Patient's cognitive ability adequate to safely complete daily activities?:  Otho Bellows - Unable to Assess) Patient able to express need for assistance with ADLs?:  (UKN - Unable to Assess) Independently performs ADLs?:  (UKN - Unable to Assess)       Abuse/Neglect Assessment (Assessment to be complete while patient is alone) Physical Abuse:  (  Unable to Assess) Verbal Abuse:  (Unable to Assess) Sexual Abuse:  (Unable to Assess) Exploitation of patient/patient's resources:  (Unable to Assess) Self-Neglect:  (Unable to Assess) Values / Beliefs Cultural Requests During Hospitalization: None Spiritual Requests During Hospitalization: None Consults Spiritual Care Consult Needed: No Social Work Consult Needed: No      Additional Information 1:1 In Past 12 Months?:  (UKN - Unable to Assess) CIRT Risk:  Otho Bellows(UKN - Unable to Assess) Elopement Risk:  Otho Bellows(UKN - Unable to Assess) Does patient have medical clearance?: Yes  Child/Adolescent Assessment Running Away Risk:  (Patient is an adult)  Disposition:  Disposition Initial  Assessment Completed for this Encounter: Yes Disposition of Patient: Referred to (Psych Consult) Patient referred to: Other (Comment) (Psych Consult)  On Site Evaluation by:   Reviewed with Physician:    Wilmon ArmsSTEVENSON, Ellin Fitzgibbons 08/12/2016 11:02 PM

## 2016-08-13 DIAGNOSIS — F250 Schizoaffective disorder, bipolar type: Secondary | ICD-10-CM | POA: Diagnosis not present

## 2016-08-13 DIAGNOSIS — F259 Schizoaffective disorder, unspecified: Secondary | ICD-10-CM

## 2016-08-13 MED ORDER — CLONIDINE HCL 0.1 MG PO TABS
0.1000 mg | ORAL_TABLET | Freq: Three times a day (TID) | ORAL | Status: DC
  Administered 2016-08-13 – 2016-08-15 (×5): 0.1 mg via ORAL
  Filled 2016-08-13 (×5): qty 1

## 2016-08-13 MED ORDER — QUETIAPINE FUMARATE ER 200 MG PO TB24
800.0000 mg | ORAL_TABLET | Freq: Every day | ORAL | Status: DC
  Administered 2016-08-14: 800 mg via ORAL
  Filled 2016-08-13 (×3): qty 2
  Filled 2016-08-13: qty 4

## 2016-08-13 MED ORDER — DIVALPROEX SODIUM 500 MG PO DR TAB
500.0000 mg | DELAYED_RELEASE_TABLET | Freq: Three times a day (TID) | ORAL | Status: DC
  Administered 2016-08-13 – 2016-08-15 (×5): 500 mg via ORAL
  Filled 2016-08-13 (×5): qty 1

## 2016-08-13 NOTE — ED Notes (Signed)
BEHAVIORAL HEALTH ROUNDING Patient sleeping: No. Patient alert and oriented: yes Behavior appropriate: Yes.  ; If no, describe:  Nutrition and fluids offered: yes Toileting and hygiene offered: Yes  Sitter present: q15 minute observations and security  monitoring Law enforcement present: Yes  ODS  

## 2016-08-13 NOTE — Clinical Social Work Note (Addendum)
[facility] Crystal at [phone] asking for updates on patient's progress.  CSW updated that patient was under IVC this morning and psych recommendation was pending.  CSW contacted Peggye Leyanette [phone] at [facility] to update her that patient is still pending psych recommendation.  CSW continuing to follow patient's progress.  5:00pm  CSW updated [place] [facility] on call worker Crystal [phone] that patient is still under IVC and also that psychiatry is recommending  Psychiatric inpatient admission.  Psychiatry made referral to TTS  to begin bed search.  CSW was informed that patient's clinical coordinator is Suzzette Rightermanda Gross [phone] who is a contact for patient.  CSW was also informed that patient has a legal guardian and Suzzette Rightermanda Gross would have that information, however Marchelle Folksmanda is unavailable today, so CSW can not contact her.  CSW to continue to follow patient's progress throughout discharge planning.  Ervin KnackEric R. Arhum Peeples, MSW, Theresia MajorsLCSWA [phone]  08/13/2016 4:56 PM

## 2016-08-13 NOTE — ED Notes (Signed)
BEHAVIORAL HEALTH ROUNDING Patient sleeping: Yes.   Patient alert and oriented: eyes closed  Appears to be asleep Behavior appropriate: Yes.  ; If no, describe:  Nutrition and fluids offered: Yes  Toileting and hygiene offered: sleeping Sitter present: q 15 minute observations and security monitoring Law enforcement present: yes  ODS 

## 2016-08-13 NOTE — ED Notes (Signed)
[facility]  [phone]  They would like a call back with pts disposition   ED Social workers phone number provided

## 2016-08-13 NOTE — ED Notes (Signed)
She is sitting on her bed - TV is on  NAD observed    BEHAVIORAL HEALTH ROUNDING Patient sleeping: No. Patient alert and oriented: yes Behavior appropriate: Yes.  ; If no, describe:  Nutrition and fluids offered: yes Toileting and hygiene offered: Yes  Sitter present: q15 minute observations and security monitoring Law enforcement present: Yes  ODS  ENVIRONMENTAL ASSESSMENT Potentially harmful objects out of patient reach: Yes.   Personal belongings secured: Yes.   Patient dressed in hospital provided attire only: Yes.   Plastic bags out of patient reach: Yes.   Patient care equipment (cords, cables, call bells, lines, and drains) shortened, removed, or accounted for: Yes.   Equipment and supplies removed from bottom of stretcher: Yes.   Potentially toxic materials out of patient reach: Yes.   Sharps container removed or out of patient reach: Yes.

## 2016-08-13 NOTE — BH Assessment (Signed)
Clinician left HIPAA compliant voicemail with [facility]- Ysidro EvertWilliam Carr- [phone] requesting return phone call.  Clinician is in need of documentation of IQ to begin placement search.
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
## 2016-08-13 NOTE — ED Notes (Signed)
Pt refused vitals to be taken

## 2016-08-13 NOTE — ED Notes (Signed)
IVC NOT  LIFTED  PER  DR  CLAPACS  HE SPOKE WITH DR  Derrill KayGOODMAN  PENDING PLACEMENT

## 2016-08-13 NOTE — ED Notes (Signed)
Pt refused to eat and threw her tray away. Pt also was demanding paper and was refusing to stay in her room.

## 2016-08-13 NOTE — ED Provider Notes (Signed)
Dr. Toni Amendlapacs has seen patient. Will continue IVC at this point.    Phineas SemenGoodman, Alonie Gazzola, MD 08/13/16 [phone]

## 2016-08-13 NOTE — ED Notes (Signed)
PT IVC PENDING CONSULT  

## 2016-08-13 NOTE — ED Notes (Signed)
PT  PUT  UNDER  IVC  PER  DR  Lujean AmelBROWN  INFORMED  AMY RN

## 2016-08-13 NOTE — ED Notes (Signed)
BEHAVIORAL HEALTH ROUNDING  PATIENT SLEEPING: Yes Patient alert & oriented: Sleeping Behavior appropriate: Sleeping Describe behavior: No inappropriate or unacceptable behaviors noted at this time. Nutrition & fluids offered: Sleeping Toileting & Hygiene offered: Sleeping Sitter present: Behavioral tech rounding every 15 minutes on patient to ensure safety. Law enforcement present: Yes Law enforcement agency: [employer] ([place])  

## 2016-08-13 NOTE — ED Notes (Signed)
BEHAVIORAL HEALTH ROUNDING  PATIENT SLEEPING: No Patient alert & oriented: Yes Behavior appropriate: Yes  Describe behavior: No inappropriate or unacceptable behaviors noted at this time. Nutrition & fluids offered: Yes Toileting & Hygiene offered: Yes Sitter present: Behavioral tech rounding every 15 minutes on patient to ensure safety. Law enforcement present: Yes Law enforcement agency: [employer] ([place])  

## 2016-08-13 NOTE — ED Notes (Signed)
Pt requesting 6 sheets of paper; was given 4. Pt requesting a pen or pencil; pt reminded that she has crayons in her room. Pt refusing to stay in her room.
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
## 2016-08-13 NOTE — ED Notes (Addendum)
meds reconciled

## 2016-08-13 NOTE — ED Notes (Signed)
BEHAVIORAL HEALTH ROUNDING  PATIENT SLEEPING: no Patient alert & oriented: Yes Behavior appropriate: Yes  Describe behavior: No inappropriate or unacceptable behaviors noted at this time. Nutrition & fluids offered: Yes Toileting & Hygiene offered: Yes Sitter present: Behavioral tech rounding every 15 minutes on patient to ensure safety. Law enforcement present: Yes Law enforcement agency: [employer] ([place])   

## 2016-08-13 NOTE — ED Notes (Signed)
ED BHU PLACEMENT JUSTIFICATION Is the patient under IVC or is there intent for IVC:  - IVC is being initiated Is the patient medically cleared: Yes.   Is there vacancy in the ED BHU: Yes.   Is the population mix appropriate for patient: Yes.  BHU will allow her a place to walk and move around  Is the patient awaiting placement in inpatient or outpatient setting:  Has the patient had a psychiatric consult:  Consult pending Survey of unit performed for contraband, proper placement and condition of furniture, tampering with fixtures in bathroom, shower, and each patient room: Yes.  ; Findings:  APPEARANCE/BEHAVIOR Calm and cooperative NEURO ASSESSMENT Orientation: oriented to self and place  Denies pain Hallucinations: No.None noted (Hallucinations) Speech: Normal Gait: normal RESPIRATORY ASSESSMENT Even  Unlabored respirations  CARDIOVASCULAR ASSESSMENT Pulses equal   regular rate  Skin warm and dry   GASTROINTESTINAL ASSESSMENT no GI complaint EXTREMITIES Full ROM  PLAN OF CARE Provide calm/safe environment. Vital signs assessed twice daily. ED BHU Assessment once each 12-hour shift. Collaborate with TTS daily or as condition indicates. Assure the ED provider has rounded once each shift. Provide and encourage hygiene. Provide redirection as needed. Assess for escalating behavior; address immediately and inform ED provider.  Assess family dynamic and appropriateness for visitation as needed: Yes.  ; If necessary, describe findings:  Educate the patient/family about BHU procedures/visitation: Yes.  ; If necessary, describe findings:

## 2016-08-13 NOTE — ED Notes (Signed)
Pt standing at the nurses station - unable to reorient or reroute to her room - pt asking for paper  Reassurance provided  Pt returned to her room - multiple sheets of papers are ripped into pieces on her bed  She says she will use them - no more paper provided  Talked with her about her plan of care -awaiting psych consult, social work consult

## 2016-08-14 LAB — URINALYSIS, COMPLETE (UACMP) WITH MICROSCOPIC
BACTERIA UA: NONE SEEN
BILIRUBIN URINE: NEGATIVE
Glucose, UA: NEGATIVE mg/dL
HGB URINE DIPSTICK: NEGATIVE
KETONES UR: NEGATIVE mg/dL
NITRITE: NEGATIVE
PH: 6 (ref 5.0–8.0)
Protein, ur: NEGATIVE mg/dL
SPECIFIC GRAVITY, URINE: 1.019 (ref 1.005–1.030)

## 2016-08-14 LAB — URINE DRUG SCREEN, QUALITATIVE (ARMC ONLY)
AMPHETAMINES, UR SCREEN: NOT DETECTED
BARBITURATES, UR SCREEN: NOT DETECTED
BENZODIAZEPINE, UR SCRN: NOT DETECTED
COCAINE METABOLITE, UR ~~LOC~~: NOT DETECTED
Cannabinoid 50 Ng, Ur ~~LOC~~: NOT DETECTED
MDMA (Ecstasy)Ur Screen: NOT DETECTED
METHADONE SCREEN, URINE: NOT DETECTED
OPIATE, UR SCREEN: NOT DETECTED
Phencyclidine (PCP) Ur S: NOT DETECTED
TRICYCLIC, UR SCREEN: POSITIVE — AB

## 2016-08-14 NOTE — ED Notes (Signed)
BEHAVIORAL HEALTH ROUNDING  PATIENT SLEEPING: Yes Patient alert & oriented: Yes Behavior appropriate: Yes  Describe behavior: No inappropriate or unacceptable behaviors noted at this time. Nutrition & fluids offered: Yes Toileting & Hygiene offered: Yes Sitter present: Behavioral tech rounding every 15 minutes on patient to ensure safety. Law enforcement present: Yes Law enforcement agency: [employer] ([place])  

## 2016-08-14 NOTE — ED Notes (Signed)
Pt showered with assistance from ed nurse amber. Bed linens changed

## 2016-08-14 NOTE — ED Notes (Signed)
meds

## 2016-08-14 NOTE — ED Notes (Signed)
TTS has spoke to MalakoffDonna at the pts assisted living facility [facility]([place] [facility]). She has shared that its the doctors (Dr. Ave Filterhandler)  [facility] or [facility]. [facility]

## 2016-08-14 NOTE — ED Notes (Signed)
BEHAVIORAL HEALTH ROUNDING  PATIENT SLEEPING: Yes Patient alert & oriented: Sleeping Behavior appropriate: Sleeping Describe behavior: No inappropriate or unacceptable behaviors noted at this time. Nutrition & fluids offered: Sleeping Toileting & Hygiene offered: Sleeping Sitter present: Behavioral tech rounding every 15 minutes on patient to ensure safety. Law enforcement present: Yes Law enforcement agency: [employer] ([place])  

## 2016-08-14 NOTE — ED Notes (Signed)
Pt given meal tray.

## 2016-08-14 NOTE — ED Notes (Signed)
Patient asleep. Tried several times to awaken her without success to take medication.

## 2016-08-14 NOTE — ED Notes (Signed)
Pt given sprite and graham cracker with peanut butter

## 2016-08-14 NOTE — ED Notes (Signed)
BEHAVIORAL HEALTH ROUNDING  PATIENT SLEEPING: Yes Patient alert & oriented: Yes Behavior appropriate: Yes  Describe behavior: No inappropriate or unacceptable behaviors noted at this time. Nutrition & fluids offered: Yes Toileting & Hygiene offered: Yes Sitter present: Behavioral tech rounding every 15 minutes on patient to ensure safety. Law enforcement present: Yes Patent examinerLaw enforcement agency: [employer] ([place])

## 2016-08-14 NOTE — ED Provider Notes (Signed)
-----------------------------------------   3:46 AM on 08/14/2016 -----------------------------------------   Blood pressure 131/89, pulse 95, temperature 98.3 F (36.8 C), temperature source Oral, resp. rate 18, SpO2 99 %.  The patient had no acute events since last update.  Calm and cooperative at this time.  Disposition is pending Psychiatry/Behavioral Medicine team recommendations.     Myrna BlazerSchaevitz, David Matthew, MD 08/14/16 [phone]

## 2016-08-14 NOTE — ED Notes (Signed)
Meal tray left in pt's room, pt sleeping

## 2016-08-14 NOTE — ED Notes (Signed)
Pt calm and cooperative. No id bracelet on pt.  New bracelet applied to right arm.

## 2016-08-14 NOTE — ED Notes (Signed)
IVC/Pending Placement 

## 2016-08-14 NOTE — Clinical Social Work Note (Addendum)
CSW received phone call from [place] on call worker Fritz Pickerel, [phone] who was following up on patient's plan.  CSW was informed that Addison Lank, patient's clinical coordinator [phone] is supposed to have a team meeting today at 2pm to discuss patient's care plan.  Psychiatry met with patient and referred to TTS to work on psychiatric inpatient admission, please refer to TTS [phone] for any other needs this CSW to sign off.  Jones Broom. [place], MSW, [place]  08/14/2016 11:36 AM

## 2016-08-15 DIAGNOSIS — F250 Schizoaffective disorder, bipolar type: Secondary | ICD-10-CM | POA: Diagnosis not present

## 2016-08-15 MED ORDER — DIVALPROEX SODIUM 500 MG PO DR TAB: 500.0000 mg | DELAYED_RELEASE_TABLET | Freq: Three times a day (TID) | ORAL | 6 refills | Status: AC

## 2016-08-15 MED ORDER — CLONIDINE HCL 0.1 MG PO TABS: 0.1000 mg | ORAL_TABLET | Freq: Three times a day (TID) | ORAL | 11 refills | Status: AC

## 2016-08-15 MED ORDER — CLONIDINE HCL 0.1 MG PO TABS: 0.1000 mg | ORAL_TABLET | Freq: Two times a day (BID) | ORAL | 11 refills | Status: AC

## 2016-08-15 MED ORDER — QUETIAPINE FUMARATE ER 400 MG PO TB24: 800.0000 mg | ORAL_TABLET | Freq: Every day | ORAL | 6 refills | Status: AC

## 2016-08-15 NOTE — Progress Notes (Signed)
LCSW was called by Holland Fallingonna Steele [facility]  [phone] and she demanded to know Charge nurse extension . LCSW provided this information to her and contacted charge nurse and reviewed current med changes and this situation. She will contact charge nurse and hopefully this matter can be resolved.   Shelanda Duvall LCSW

## 2016-08-15 NOTE — ED Notes (Signed)
meds adminstered as ordered  Assessment completed

## 2016-08-15 NOTE — ED Notes (Signed)
BEHAVIORAL HEALTH ROUNDING Patient sleeping: Yes.   Patient alert and oriented: eyes closed  Appears to be asleep Behavior appropriate: Yes.  ; If no, describe:  Nutrition and fluids offered: Yes  Toileting and hygiene offered: sleeping Sitter present: q 15 minute observations and security monitoring Law enforcement present: yes  ODS 

## 2016-08-15 NOTE — ED Notes (Signed)
Lunch was given to patient 

## 2016-08-15 NOTE — Progress Notes (Signed)
LCSW called Vilma Praderonna Steel RN from Nurse from [phone]- [phone]. Dr Toni Amendlapacs answered RN questions and she stated she will [facility]ette.  Dr Toni Amendlapacs to complete discharge note and rescind IVC  Theador Jezewski RichmondBandi LCSW [phone]

## 2016-08-15 NOTE — ED Notes (Signed)
Alvira PhilipsGeraldine, care giver, [facility], last dosage at 1616 reported at 2156 bu this RN

## 2016-08-15 NOTE — ED Notes (Signed)
BEHAVIORAL HEALTH ROUNDING Patient sleeping: No. Patient alert and oriented: yes Behavior appropriate: Yes.  ; If no, describe:  Nutrition and fluids offered: yes Toileting and hygiene offered: Yes  Sitter present: q15 minute observations and security  monitoring Law enforcement present: Yes  ODS  

## 2016-08-15 NOTE — ED Provider Notes (Signed)
-----------------------------------------   7:33 AM on 08/15/2016 -----------------------------------------   Blood pressure 111/77, pulse 76, temperature 98.9 F (37.2 C), temperature source Oral, resp. rate 18, SpO2 97 %.  The patient had no acute events since last update.  Calm and cooperative at this time.  Disposition is pending Psychiatry/Behavioral Medicine team recommendations.     Sharyn CreamerQuale, Mark, MD 08/15/16 [phone]

## 2016-08-15 NOTE — ED Notes (Signed)
ED BHU PLACEMENT JUSTIFICATION Is the patient under IVC or is there intent for IVC: Yes.   Is the patient medically cleared: Yes.   Is there vacancy in the ED BHU: Yes.   Is the population mix appropriate for patient: Yes.   Is the patient awaiting placement in inpatient or outpatient setting: Yes.   Has the patient had a psychiatric consult: Yes.   Survey of unit performed for contraband, proper placement and condition of furniture, tampering with fixtures in bathroom, shower, and each patient room: Yes.  ; Findings:  APPEARANCE/BEHAVIOR Calm and cooperative NEURO ASSESSMENT Orientation: oriented to self and place Denies pain Hallucinations: No.None noted (Hallucinations) Speech: Normal Gait: normal RESPIRATORY ASSESSMENT Even  Unlabored respirations  CARDIOVASCULAR ASSESSMENT Pulses equal   regular rate  Skin warm and dry   GASTROINTESTINAL ASSESSMENT no GI complaint EXTREMITIES Full ROM  PLAN OF CARE Provide calm/safe environment. Vital signs assessed twice daily. ED BHU Assessment once each 12-hour shift. Collaborate with TTS daily or as condition indicates. Assure the ED provider has rounded once each shift. Provide and encourage hygiene. Provide redirection as needed. Assess for escalating behavior; address immediately and inform ED provider.  Assess family dynamic and appropriateness for visitation as needed: Yes.  ; If necessary, describe findings:  Educate the patient/family about BHU procedures/visitation: Yes.  ; If necessary, describe findings:

## 2016-08-15 NOTE — Progress Notes (Signed)
LCSW [facility] [phone] and she reported that the TTS social worker referred her out [facility] [place] and currently there are no beds available. In consultation with Dr Toni Amendlapacs this patient is to return to her Group Home.  LCSW called [facility] to inform them patient is to be discharged by to Group Home spoke and left message for them.   Delta Air LinesClaudine Deng Kemler LCSW [phone]
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
## 2016-08-15 NOTE — Progress Notes (Signed)
LCSW called Patients [facility] Coordinator [facility] [phone] and explained the patient was assessed and will be returning to Group Home. LCSW did call [facility] and they had no beds/nor does patient meet criteria for inpatient

## 2016-08-15 NOTE — ED Notes (Signed)
Patient still resting at this time.

## 2016-08-15 NOTE — Progress Notes (Addendum)
LCSW [facility] and [facility] [phone] and patient will be picked up at 6:30-7pm this evening  Nannette called this worker back and wanted to know about medication change for patient.   It was explained to [facility] Nurse pts  depakote was slightly increased and that nurse was told she could follow up with patients regular psychiatrist by Dr Cathlean Marseilleslapcas  Tivis Wherry LCSW

## 2016-08-15 NOTE — ED Notes (Signed)

## 2016-08-15 NOTE — Progress Notes (Signed)
LCSW followed up with [name] home and yesterday they had a plan of care meeting and was decided this patient will need to go to [name] or [name].  In discussion with the nurse there has been no acting out and she no longer meets in patient criteria for [facility].    LCSW consulted with TTS and LCSW agreed to follow up with  Monica [name]  Monica [name] [phone]  To see if they have made referral to Monica [name].  These are patient providers from Monica [name] [phone]  Monica [name] [phone] Monica [name] [phone] Monica [name] [phone]    Monica Senatelaudine Inaaya Vellucci LCSW [phone]

## 2016-08-15 NOTE — ED Notes (Signed)
PT IVC/ PENDING PLACEMENT  

## 2016-08-15 NOTE — ED Notes (Signed)
Patient observed lying in bed with eyes closed  Even, unlabored respirations observed   NAD pt appears to be sleeping  I will continue to monitor along with every 15 minute visual observations and ongoing security monitoring    

## 2016-08-15 NOTE — ED Notes (Signed)
Patient sleeping at this time.

## 2016-11-15 ENCOUNTER — Encounter: Payer: Self-pay | Admitting: Emergency Medicine

## 2016-11-15 ENCOUNTER — Emergency Department
Admission: EM | Admit: 2016-11-15 | Discharge: 2016-11-15 | Disposition: A | Discharge status: Home / Self Care | Payer: Medicaid Other | Attending: Student in an Organized Health Care Education/Training Program | Admitting: Student in an Organized Health Care Education/Training Program

## 2016-11-15 DIAGNOSIS — R456 Violent behavior: Secondary | ICD-10-CM

## 2016-11-15 DIAGNOSIS — R739 Hyperglycemia, unspecified: Secondary | ICD-10-CM | POA: Insufficient documentation

## 2016-11-15 DIAGNOSIS — R451 Restlessness and agitation: Secondary | ICD-10-CM | POA: Diagnosis present

## 2016-11-15 DIAGNOSIS — R000 Tachycardia, unspecified: Secondary | ICD-10-CM | POA: Diagnosis not present

## 2016-11-15 LAB — CBC WITH DIFFERENTIAL/PLATELET
BASOS PCT: 0 %
Basophils Absolute: 0 10*3/uL (ref 0–0.1)
EOS ABS: 0.1 10*3/uL (ref 0–0.7)
Eosinophils Relative: 2 %
HCT: 34.1 % — ABNORMAL LOW (ref 35.0–47.0)
HEMOGLOBIN: 11.6 g/dL — AB (ref 12.0–16.0)
Lymphocytes Relative: 34 %
Lymphs Abs: 1.4 10*3/uL (ref 1.0–3.6)
MCH: 31.5 pg (ref 26.0–34.0)
MCHC: 34 g/dL (ref 32.0–36.0)
MCV: 92.6 fL (ref 80.0–100.0)
Monocytes Absolute: 0.4 10*3/uL (ref 0.2–0.9)
Monocytes Relative: 10 %
NEUTROS PCT: 54 %
Neutro Abs: 2.3 10*3/uL (ref 1.4–6.5)
Platelets: 188 10*3/uL (ref 150–440)
RBC: 3.68 MIL/uL — AB (ref 3.80–5.20)
RDW: 14.2 % (ref 11.5–14.5)
WBC: 4.3 10*3/uL (ref 3.6–11.0)

## 2016-11-15 LAB — BASIC METABOLIC PANEL
ANION GAP: 10 (ref 5–15)
BUN: 9 mg/dL (ref 6–20)
CHLORIDE: 107 mmol/L (ref 101–111)
CO2: 22 mmol/L (ref 22–32)
CREATININE: 1.18 mg/dL — AB (ref 0.44–1.00)
Calcium: 9.3 mg/dL (ref 8.9–10.3)
GFR calc non Af Amer: >60 mL/min (ref >60)
Glucose, Bld: 317 mg/dL — ABNORMAL HIGH (ref 65–99)
Potassium: 3.2 mmol/L — ABNORMAL LOW (ref 3.5–5.1)
SODIUM: 139 mmol/L (ref 135–145)

## 2016-11-15 LAB — GLUCOSE, CAPILLARY: GLUCOSE-CAPILLARY: 307 mg/dL — AB (ref 65–99)

## 2016-11-15 MED ORDER — SODIUM CHLORIDE 0.9 % IV BOLUS (SEPSIS)
1000.0000 mL | Freq: Once | INTRAVENOUS | Status: AC
  Administered 2016-11-15: 1000 mL via INTRAVENOUS

## 2016-11-15 MED ORDER — POTASSIUM CHLORIDE CRYS ER 20 MEQ PO TBCR
40.0000 meq | EXTENDED_RELEASE_TABLET | Freq: Once | ORAL | Status: AC
  Administered 2016-11-15: 40 meq via ORAL
  Filled 2016-11-15: qty 2

## 2016-11-15 NOTE — ED Notes (Signed)
BEHAVIORAL HEALTH ROUNDING Patient sleeping: No. Patient alert and oriented: yes Behavior appropriate: Yes.  ; If no, describe:  Nutrition and fluids offered: Yes  Toileting and hygiene offered: Yes  Sitter present: q 15 min checks Law enforcement present: Yes  
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
## 2016-11-15 NOTE — BH Assessment (Signed)
This Clinical research associatewriter was informed by Colan Neptuneempia Foster ([facility] Care Coordinator: [phone]) that patient has been accepted to [facility] for inpatient treatment ([facility] Programming Unit). This writer called [facility] [phone]) and spoke to intake staff member [facility] to confirm this information. She reports the patient has been accepted to their facility; however, prior to admission "she would need to be seen by a [facility] to have a 1st exam completed". She is requesting that before patient can be transported to their facility they would need the following information faxed to [phone]: 1st exam, demographics, and IVC paperwork.  Additional Contacts: GuardianRon Agee: Lavern Holeman (Grandmother) [phone] / [phone] Colan Neptuneempia Foster [facility] Care Coordinator - [phone]  Ysidro EvertWilliam Carr (Group Home Director) - [phone] Group Home - [phone]  Care Coordinator is requesting to be informed of pt's disposition.

## 2016-11-15 NOTE — ED Notes (Signed)
Notified patient's guardian and care coordinator that patient is being [facility] at this time. Both verbalized understanding.

## 2017-12-17 ENCOUNTER — Emergency Department
Admission: EM | Admit: 2017-12-17 | Discharge: 2017-12-17 | Discharge status: Against Medical Advice | Payer: Medicaid Other | Attending: Emergency Medicine | Admitting: Emergency Medicine

## 2017-12-17 DIAGNOSIS — Z5321 Procedure and treatment not carried out due to patient leaving prior to being seen by health care provider: Secondary | ICD-10-CM | POA: Insufficient documentation

## 2017-12-17 DIAGNOSIS — Z041 Encounter for examination and observation following transport accident: Secondary | ICD-10-CM | POA: Insufficient documentation

## 2017-12-17 NOTE — ED Notes (Signed)
Mother and caregiver states she is restless   They want to take her home   Encourage to wait  W/o success
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
## 2017-12-17 NOTE — ED Notes (Signed)
Pt has legal guardian, mom, Marlana SalvageLaverne Holman. Attempted to call at [phone] with no answer. Person here with pt (who is also being seen) states that someone has already called mom.

## 2019-01-21 ENCOUNTER — Other Ambulatory Visit: Payer: Self-pay

## 2019-01-21 ENCOUNTER — Ambulatory Visit (LOCAL_COMMUNITY_HEALTH_CENTER): Payer: Medicaid Other

## 2019-01-21 DIAGNOSIS — Z23 Encounter for immunization: Secondary | ICD-10-CM

## 2019-01-21 MED ORDER — FLUZONE QUADRIVALENT 0.5 ML IM SUSY: 0.5000 mL | PREFILLED_SYRINGE | Freq: Once | INTRAMUSCULAR | 0 refills | Status: DC
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
## 2021-05-21 ENCOUNTER — Encounter: Payer: Self-pay | Admitting: Emergency Medicine

## 2021-05-21 ENCOUNTER — Emergency Department
Admission: EM | Admit: 2021-05-21 | Discharge: 2021-05-21 | Disposition: A | Discharge status: Home / Self Care | Payer: Medicaid Other | Attending: Emergency Medicine | Admitting: Emergency Medicine

## 2021-05-21 ENCOUNTER — Other Ambulatory Visit: Payer: Self-pay

## 2021-05-21 DIAGNOSIS — T39011A Poisoning by aspirin, accidental (unintentional), initial encounter: Secondary | ICD-10-CM | POA: Diagnosis not present

## 2021-05-21 DIAGNOSIS — T381X1A Poisoning by thyroid hormones and substitutes, accidental (unintentional), initial encounter: Secondary | ICD-10-CM | POA: Insufficient documentation

## 2021-05-21 DIAGNOSIS — T43501A Poisoning by unspecified antipsychotics and neuroleptics, accidental (unintentional), initial encounter: Secondary | ICD-10-CM | POA: Insufficient documentation

## 2021-05-21 DIAGNOSIS — T4271XA Poisoning by unspecified antiepileptic and sedative-hypnotic drugs, accidental (unintentional), initial encounter: Secondary | ICD-10-CM | POA: Diagnosis not present

## 2021-05-21 DIAGNOSIS — G4700 Insomnia, unspecified: Secondary | ICD-10-CM | POA: Insufficient documentation

## 2021-05-21 DIAGNOSIS — T472X1A Poisoning by stimulant laxatives, accidental (unintentional), initial encounter: Secondary | ICD-10-CM | POA: Diagnosis not present

## 2021-05-21 DIAGNOSIS — R7309 Other abnormal glucose: Secondary | ICD-10-CM | POA: Insufficient documentation

## 2021-05-21 DIAGNOSIS — T447X1A Poisoning by beta-adrenoreceptor antagonists, accidental (unintentional), initial encounter: Secondary | ICD-10-CM | POA: Diagnosis not present

## 2021-05-21 DIAGNOSIS — T474X1A Poisoning by other laxatives, accidental (unintentional), initial encounter: Secondary | ICD-10-CM | POA: Diagnosis not present

## 2021-05-21 DIAGNOSIS — T466X1A Poisoning by antihyperlipidemic and antiarteriosclerotic drugs, accidental (unintentional), initial encounter: Secondary | ICD-10-CM | POA: Insufficient documentation

## 2021-05-21 DIAGNOSIS — T478X1A Poisoning by other agents primarily affecting gastrointestinal system, accidental (unintentional), initial encounter: Secondary | ICD-10-CM | POA: Diagnosis not present

## 2021-05-21 DIAGNOSIS — T43221A Poisoning by selective serotonin reuptake inhibitors, accidental (unintentional), initial encounter: Secondary | ICD-10-CM | POA: Diagnosis not present

## 2021-05-21 DIAGNOSIS — T452X1A Poisoning by vitamins, accidental (unintentional), initial encounter: Secondary | ICD-10-CM | POA: Diagnosis not present

## 2021-05-21 DIAGNOSIS — T50901A Poisoning by unspecified drugs, medicaments and biological substances, accidental (unintentional), initial encounter: Secondary | ICD-10-CM

## 2021-05-21 DIAGNOSIS — T383X1A Poisoning by insulin and oral hypoglycemic [antidiabetic] drugs, accidental (unintentional), initial encounter: Secondary | ICD-10-CM | POA: Insufficient documentation

## 2021-05-21 DIAGNOSIS — T565X1A Toxic effect of zinc and its compounds, accidental (unintentional), initial encounter: Secondary | ICD-10-CM | POA: Insufficient documentation

## 2021-05-21 LAB — URINALYSIS, ROUTINE W REFLEX MICROSCOPIC
Bacteria, UA: NONE SEEN
Bilirubin Urine: NEGATIVE
Glucose, UA: NEGATIVE mg/dL
Hgb urine dipstick: NEGATIVE
Ketones, ur: NEGATIVE mg/dL
Nitrite: NEGATIVE
Protein, ur: NEGATIVE mg/dL
Specific Gravity, Urine: 1.003 — ABNORMAL LOW (ref 1.005–1.030)
pH: 7 (ref 5.0–8.0)

## 2021-05-21 LAB — CBG MONITORING, ED: Glucose-Capillary: 108 mg/dL — ABNORMAL HIGH (ref 70–99)

## 2021-05-21 LAB — POC URINE PREG, ED: Preg Test, Ur: NEGATIVE
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
## 2021-05-21 NOTE — ED Triage Notes (Signed)
Pt to ED via POV with Anselm Pancoast staff member, pt took all her 7 am medications and also another clients 7a medications. Staff member reports that she is acting normal may be getting a little "groggy" pt is walking around the triage room. NAD.

## 2021-05-21 NOTE — ED Notes (Signed)
See triage note  presents with caregiver  she was given her meds along with another clients meds  acting normal on arrival

## 2021-05-21 NOTE — ED Notes (Signed)
First Nurse Note:  Pt brought in by caregiver who states that pt was given her medications plus another residents medications. Pt caregiver has a list of the medications patient was given. Caregiver states that pt is acting herself. Pt does not appear to be in any distress.

## 2023-11-04 ENCOUNTER — Encounter: Payer: Self-pay | Admitting: Certified Nurse Midwife

## 2023-11-04 ENCOUNTER — Ambulatory Visit: Payer: MEDICAID | Admitting: Certified Nurse Midwife

## 2023-11-04 VITALS — BP 113/75 | HR 90 | Ht 64.0 in | Wt 139.0 lb

## 2023-11-04 DIAGNOSIS — Z01419 Encounter for gynecological examination (general) (routine) without abnormal findings: Secondary | ICD-10-CM | POA: Diagnosis not present

## 2023-11-04 DIAGNOSIS — Z7689 Persons encountering health services in other specified circumstances: Secondary | ICD-10-CM

## 2023-11-04 DIAGNOSIS — Z124 Encounter for screening for malignant neoplasm of cervix: Secondary | ICD-10-CM

## 2023-11-04 NOTE — Progress Notes (Signed)
 ANNUAL EXAM Patient name: Monica [name] MRN [id number]  Date of birth: [birth year] Chief Complaint:   Establish Care and Annual Exam  History of Present Illness:   Monica [name] is a 33 y.o. G0P0000 African-American female being seen today for a routine annual exam. She is intellectually disabled and is accompanied by her caregiver from her group home. Pelvic exam declined. On depo and amenorrheic. Has PCP who follows DM, mood disorder & prescribes Depo.  No LMP recorded. Patient has had an injection.   Upstream - 11/04/23 1551       Pregnancy Intention Screening   Does the patient want to become pregnant in the next year? Yes    Does the patient's partner want to become pregnant in the next year? N/A    Would the patient like to discuss contraceptive options today? No      Contraception Wrap Up   Current Method Hormonal Injection    End Method Hormonal Injection    Contraception Counseling Provided No    How was the end contraceptive method provided? N/A   receives from PCP        The pregnancy intention screening data noted above was reviewed. Potential methods of contraception were discussed. The patient elected to proceed with Hormonal Injection.   No results found for: DIAGPAP, HPVHIGH, ADEQPAP     Last pap unknown, declined.  Last mammogram: n/a. Results were: N/A. Family h/o breast cancer: unknown Last colonoscopy: n/a. Results were: N/A. Family h/o colorectal cancer: unknown    Past Medical History:  Diagnosis Date   Mentally disabled    Schizo affective schizophrenia (HCC)    Seizures (HCC)     Family History  Family history unknown: Yes   Review of Systems:   Pertinent items are noted in HPI Caregiver denies any changes or concerns Pertinent History Reviewed:  Reviewed past medical,surgical, social and family history.  Reviewed problem list, medications and allergies. Physical Assessment:   Vitals:   11/04/23 0915  BP: 113/75   Pulse: 90  Weight: 139 lb (63 kg)  Height: 5' 4 (1.626 m)  Body mass index is 23.86 kg/m.       Physical Exam Constitutional:      General: She is not in acute distress.    Appearance: Normal appearance.  HENT:     Head: Normocephalic.  Cardiovascular:     Rate and Rhythm: Normal rate and regular rhythm.  Pulmonary:     Effort: Pulmonary effort is normal.     Breath sounds: Normal breath sounds.  Chest:  Breasts:    Tanner Score is 5.     Breasts are symmetrical.     Right: Normal.     Left: Normal.  Abdominal:     General: Abdomen is flat.     Palpations: Abdomen is soft.     Tenderness: There is no abdominal tenderness.  Lymphadenopathy:     Upper Body:     Right upper body: No axillary adenopathy.     Left upper body: No axillary adenopathy.  Neurological:     Mental Status: She is alert. Mental status is at baseline.  Psychiatric:        Mood and Affect: Mood is anxious. Affect is tearful.        Behavior: Behavior is agitated. Behavior is not aggressive. Behavior is cooperative.        Cognition and Memory: Cognition is impaired. Memory is impaired.      No results found  for this or any previous visit (from the past 24 hours).  Assessment & Plan:  1. Encounter to establish care  2. Well woman exam (Primary)  Labs followed by PCP, Pap & STI screening deferred Mammogram: @ 32yo, or sooner if problems Colonoscopy: @ 32yo, or sooner if problems  No orders of the defined types were placed in this encounter.   Meds: No orders of the defined types were placed in this encounter.   Follow-up: No follow-ups on file.  Harlene LITTIE Cisco, CNM 11/04/2023 3:56 PM
